# Patient Record
Sex: Male | Born: 1975 | Race: White | Hispanic: No | Marital: Married | Smoking: Former smoker
Health system: Southern US, Community
[De-identification: ages and names within clinical notes are randomized; demographics above are authoritative.]

## PROBLEM LIST (undated history)

## (undated) DIAGNOSIS — R569 Unspecified convulsions: Secondary | ICD-10-CM

## (undated) DIAGNOSIS — R51 Headache: Secondary | ICD-10-CM

## (undated) DIAGNOSIS — K644 Residual hemorrhoidal skin tags: Secondary | ICD-10-CM

## (undated) DIAGNOSIS — K219 Gastro-esophageal reflux disease without esophagitis: Secondary | ICD-10-CM

## (undated) DIAGNOSIS — M419 Scoliosis, unspecified: Secondary | ICD-10-CM

## (undated) DIAGNOSIS — S0291XA Unspecified fracture of skull, initial encounter for closed fracture: Secondary | ICD-10-CM

## (undated) DIAGNOSIS — M545 Low back pain: Secondary | ICD-10-CM

## (undated) DIAGNOSIS — R011 Cardiac murmur, unspecified: Secondary | ICD-10-CM

## (undated) DIAGNOSIS — S069X9A Unspecified intracranial injury with loss of consciousness of unspecified duration, initial encounter: Secondary | ICD-10-CM

## (undated) DIAGNOSIS — T148XXA Other injury of unspecified body region, initial encounter: Secondary | ICD-10-CM

## (undated) DIAGNOSIS — G4730 Sleep apnea, unspecified: Secondary | ICD-10-CM

## (undated) DIAGNOSIS — F419 Anxiety disorder, unspecified: Secondary | ICD-10-CM

## (undated) DIAGNOSIS — I209 Angina pectoris, unspecified: Secondary | ICD-10-CM

## (undated) DIAGNOSIS — R413 Other amnesia: Secondary | ICD-10-CM

## (undated) DIAGNOSIS — Z87820 Personal history of traumatic brain injury: Secondary | ICD-10-CM

## (undated) DIAGNOSIS — F329 Major depressive disorder, single episode, unspecified: Secondary | ICD-10-CM

## (undated) DIAGNOSIS — F32A Depression, unspecified: Secondary | ICD-10-CM

## (undated) DIAGNOSIS — M1990 Unspecified osteoarthritis, unspecified site: Secondary | ICD-10-CM

## (undated) DIAGNOSIS — G43909 Migraine, unspecified, not intractable, without status migrainosus: Secondary | ICD-10-CM

## (undated) DIAGNOSIS — G8929 Other chronic pain: Secondary | ICD-10-CM

## (undated) HISTORY — DX: Other amnesia: R41.3

## (undated) HISTORY — DX: Migraine, unspecified, not intractable, without status migrainosus: G43.909

## (undated) HISTORY — PX: SHOULDER ARTHROSCOPY W/ ROTATOR CUFF REPAIR: SHX2400

## (undated) HISTORY — DX: Other injury of unspecified body region, initial encounter: T14.8XXA

## (undated) HISTORY — DX: Personal history of traumatic brain injury: Z87.820

## (undated) HISTORY — PX: ANTERIOR CRUCIATE LIGAMENT REPAIR: SHX115

---

## 2000-11-17 ENCOUNTER — Emergency Department (HOSPITAL_COMMUNITY): Admission: EM | Admit: 2000-11-17 | Discharge: 2000-11-17 | Payer: Self-pay | Admitting: Emergency Medicine

## 2000-11-17 ENCOUNTER — Encounter: Payer: Self-pay | Admitting: Emergency Medicine

## 2004-12-17 DIAGNOSIS — S0291XA Unspecified fracture of skull, initial encounter for closed fracture: Secondary | ICD-10-CM

## 2004-12-17 DIAGNOSIS — S069XAA Unspecified intracranial injury with loss of consciousness status unknown, initial encounter: Secondary | ICD-10-CM

## 2004-12-17 DIAGNOSIS — S069X9A Unspecified intracranial injury with loss of consciousness of unspecified duration, initial encounter: Secondary | ICD-10-CM

## 2004-12-17 HISTORY — DX: Unspecified fracture of skull, initial encounter for closed fracture: S02.91XA

## 2004-12-17 HISTORY — DX: Unspecified intracranial injury with loss of consciousness of unspecified duration, initial encounter: S06.9X9A

## 2004-12-17 HISTORY — DX: Unspecified intracranial injury with loss of consciousness status unknown, initial encounter: S06.9XAA

## 2005-02-13 ENCOUNTER — Encounter: Admission: RE | Admit: 2005-02-13 | Discharge: 2005-02-13 | Payer: Self-pay | Admitting: Orthopedic Surgery

## 2005-05-30 ENCOUNTER — Ambulatory Visit: Payer: Self-pay | Admitting: Physical Medicine & Rehabilitation

## 2005-05-30 ENCOUNTER — Inpatient Hospital Stay (HOSPITAL_COMMUNITY): Admission: AC | Admit: 2005-05-30 | Discharge: 2005-06-14 | Payer: Self-pay | Admitting: Emergency Medicine

## 2005-06-01 ENCOUNTER — Ambulatory Visit: Payer: Self-pay | Admitting: Cardiology

## 2005-06-26 ENCOUNTER — Encounter: Admission: RE | Admit: 2005-06-26 | Discharge: 2005-09-24 | Payer: Self-pay | Admitting: Psychology

## 2005-09-25 ENCOUNTER — Encounter: Admission: RE | Admit: 2005-09-25 | Discharge: 2005-12-24 | Payer: Self-pay | Admitting: Psychology

## 2005-10-31 ENCOUNTER — Encounter: Admission: RE | Admit: 2005-10-31 | Discharge: 2005-10-31 | Payer: Self-pay | Admitting: Neurosurgery

## 2008-01-23 ENCOUNTER — Encounter: Admission: RE | Admit: 2008-01-23 | Discharge: 2008-01-23 | Payer: Self-pay | Admitting: Orthopedic Surgery

## 2010-01-30 ENCOUNTER — Encounter: Payer: Self-pay | Admitting: Nurse Practitioner

## 2010-02-01 ENCOUNTER — Encounter (INDEPENDENT_AMBULATORY_CARE_PROVIDER_SITE_OTHER): Payer: Self-pay | Admitting: *Deleted

## 2010-02-06 ENCOUNTER — Encounter: Payer: Self-pay | Admitting: Nurse Practitioner

## 2010-02-14 ENCOUNTER — Telehealth: Payer: Self-pay | Admitting: Gastroenterology

## 2010-02-15 ENCOUNTER — Ambulatory Visit: Payer: Self-pay | Admitting: Gastroenterology

## 2010-02-15 ENCOUNTER — Encounter: Payer: Self-pay | Admitting: Nurse Practitioner

## 2010-02-15 DIAGNOSIS — K625 Hemorrhage of anus and rectum: Secondary | ICD-10-CM | POA: Insufficient documentation

## 2010-02-15 DIAGNOSIS — R112 Nausea with vomiting, unspecified: Secondary | ICD-10-CM

## 2010-02-15 DIAGNOSIS — R197 Diarrhea, unspecified: Secondary | ICD-10-CM

## 2010-02-15 DIAGNOSIS — M549 Dorsalgia, unspecified: Secondary | ICD-10-CM | POA: Insufficient documentation

## 2010-02-15 DIAGNOSIS — R1013 Epigastric pain: Secondary | ICD-10-CM

## 2010-02-15 DIAGNOSIS — R799 Abnormal finding of blood chemistry, unspecified: Secondary | ICD-10-CM | POA: Insufficient documentation

## 2010-02-15 DIAGNOSIS — F329 Major depressive disorder, single episode, unspecified: Secondary | ICD-10-CM

## 2010-02-15 DIAGNOSIS — R1319 Other dysphagia: Secondary | ICD-10-CM | POA: Insufficient documentation

## 2010-02-16 ENCOUNTER — Ambulatory Visit: Payer: Self-pay | Admitting: Gastroenterology

## 2010-02-17 ENCOUNTER — Encounter: Payer: Self-pay | Admitting: Gastroenterology

## 2010-02-17 ENCOUNTER — Telehealth: Payer: Self-pay | Admitting: Gastroenterology

## 2010-02-20 ENCOUNTER — Ambulatory Visit (HOSPITAL_COMMUNITY): Admission: RE | Admit: 2010-02-20 | Discharge: 2010-02-20 | Payer: Self-pay | Admitting: Gastroenterology

## 2010-02-21 ENCOUNTER — Telehealth: Payer: Self-pay | Admitting: Gastroenterology

## 2010-02-22 ENCOUNTER — Ambulatory Visit: Payer: Self-pay | Admitting: Gastroenterology

## 2010-02-22 ENCOUNTER — Encounter (INDEPENDENT_AMBULATORY_CARE_PROVIDER_SITE_OTHER): Payer: Self-pay | Admitting: *Deleted

## 2010-02-27 ENCOUNTER — Ambulatory Visit: Payer: Self-pay | Admitting: Gastroenterology

## 2011-01-07 ENCOUNTER — Encounter: Payer: Self-pay | Admitting: Gastroenterology

## 2011-01-16 NOTE — Procedures (Signed)
Summary: Upper Endoscopy  Patient: Valentine Barney Note: All result statuses are Final unless otherwise noted.  Tests: (1) Upper Endoscopy (EGD)   EGD Upper Endoscopy       DONE (C)     [facility]     [address]     [place], [place]  [address]           ENDOSCOPY PROCEDURE REPORT           PATIENT:  Alf, Doyle  MR#:  [id number]     BIRTHDATE:  07/04/1976, 33 yrs. old  GENDER:  male           ENDOSCOPIST:  Barbette Hair. Arlyce Dice, MD     Referred by:           PROCEDURE DATE:  02/16/2010     PROCEDURE:  EGD, diagnostic     ASA CLASS:  Class II     INDICATIONS:  abdominal pain           MEDICATIONS:   Fentanyl 100 mcg IV, Versed 10 mg IV, Benadryl 50     mg IV, glycopyrrolate (Robinal) 0.2 mg IV, simethacone 0.6 cc PO     9correction)     TOPICAL ANESTHETIC:  Exactacain Spray           DESCRIPTION OF PROCEDURE:   After the risks benefits and     alternatives of the procedure were thoroughly explained, informed     consent was obtained.  The LB GIF-H180 [id number] endoscope was     introduced through the mouth and advanced to the third portion of     the duodenum, without limitations.  The instrument was slowly     withdrawn as the mucosa was fully examined.     <<PROCEDUREIMAGES>>           The upper, middle, and distal third of the esophagus were     carefully inspected and no abnormalities were noted. The z-line     was well seen at the GEJ. The endoscope was pushed into the fundus     which was normal including a retroflexed view. The antrum,gastric     body, first and second part of the duodenum were unremarkable (see     image1, image2, image3, image4, image5, image6, image7, image8,     and image9).    Retroflexed views revealed no abnormalities.     The scope was then withdrawn from the patient and the procedure     completed.           COMPLICATIONS:  None           ENDOSCOPIC IMPRESSION:     1) Normal EGD     2) hyomax as needed for pain  RECOMMENDATIONS:     1) My office will arrange for you to have an abdominal     ultrasound performed.     2) Call office next 2-3 days to schedule an office appointment     for 2 weeks           REPEAT EXAM:  No           ______________________________     Barbette Hair. Arlyce Dice, MD           CC:  Cannon Kettle NP           n.     REVISED:  02/22/2010 10:40 AM     eSIGNED:   Barbette Hair. Uchenna Seufert at 02/22/2010 10:40 AM  Dail, Lerew, [id number]  Note: An exclamation mark (!) indicates a result that was not dispersed into the flowsheet. Document Creation Date: 02/22/2010 10:41 AM _______________________________________________________________________  (1) Order result status: Final Collection or observation date-time: 02/16/2010 16:23 Requested date-time:  Receipt date-time:  Reported date-time:  Referring Physician:   Ordering Physician: Melvia Heaps [phone]) Specimen Source:  Source: Launa Grill Order Number: [id number] Lab site:

## 2011-01-16 NOTE — Progress Notes (Signed)
Summary: triage  Phone Note From Other Clinic Call back at [phone]   Caller: [place], med assist Call For: Dr. Russella Dar Reason for Call: Schedule Patient Appt Summary of Call: Mitzi Hansen, NP would like pt seen sooner than sch'ed 3/17 for abd pain and fam hx col cancer Initial call taken by: Vallarie Mare,  February 14, 2010 10:50 AM  Follow-up for Phone Call        Patient  rescheduled to see Willette Cluster RNP 02-15-10 9:00 Follow-up by: Darcey Nora RN, CGRN,  February 14, 2010 10:55 AM

## 2011-01-16 NOTE — Letter (Signed)
Summary: New Patient letter  [facility]  [address] [place], [place] [address]   Phone: [phone]  Fax: [phone]       02/01/2010 MRN: [id number]  Shannon Pace [address] [place], [place]  [address]  Dear Shannon Pace,  Welcome to the Gastroenterology Division at [facility].    You are scheduled to see Dr.  Russella Dar on 03-02-10 at 10:00a.m. on the 3rd floor at [facility], [address]  We ask that you try to arrive at our office 15 minutes prior to your appointment time to allow for check-in.  We would like you to complete the enclosed self-administered evaluation form prior to your visit and bring it with you on the day of your appointment.  We will review it with you.  Also, please bring a complete list of all your medications or, if you prefer, bring the medication bottles and we will list them.  Please bring your insurance card so that we may make a copy of it.  If your insurance requires a referral to see a specialist, please bring your referral form from your primary care physician.  Co-payments are due at the time of your visit and may be paid by cash, check or credit card.     Your office visit will consist of a consult with your physician (includes a physical exam), any laboratory testing he/she may order, scheduling of any necessary diagnostic testing (e.g. x-ray, ultrasound, CT-scan), and scheduling of a procedure (e.g. Endoscopy, Colonoscopy) if required.  Please allow enough time on your schedule to allow for any/all of these possibilities.    If you cannot keep your appointment, please call [phone] to cancel or reschedule prior to your appointment date.  This allows [place] the opportunity to schedule an appointment for another patient in need of care.  If you do not cancel or reschedule by 5 p.m. the business day prior to your appointment date, you will be charged a $50.00 late cancellation/no-show fee.    Thank you for  choosing [facility] for your medical needs.  We appreciate the opportunity to care for you.  Please visit [place] at our website  to learn more about our practice.                     Sincerely,                                                             The Gastroenterology Division

## 2011-01-16 NOTE — Letter (Signed)
Summary: Out of Work  [facility]  [address] [place], [place] [address]   Phone: [phone]  Fax: [phone]    February 15, 2010   Employee:  YESHAYA VATH    To Whom It May Concern:   For Medical reasons, please excuse the above named employee from work for the following dates:  Start:   02-14-10  Including 02-15-10  If you need additional information, please feel free to contact our office.         Sincerely,     Willette Cluster [facility]

## 2011-01-16 NOTE — Letter (Signed)
Summary: EGD Instructions  [facility]  [address] [place], [place] [address]   Phone: [phone]  Fax: [phone]       Shannon Pace    1976/12/09    MRN: [id number]       Procedure Day /Date: 02-27-10     Arrival Time: 9:00 AM      Procedure Time: 10:00 AM     Location of Procedure:                    X     [facility] (4th Floor)  PREPARATION FOR ENDOSCOPY   On 02-27-10 THE DAY OF THE PROCEDURE:  1.   No solid foods, milk or milk products are allowed after midnight the night before your procedure.  2.   Do not drink anything colored red or purple.  Avoid juices with pulp.  No orange juice.  3.  You may drink clear liquids until 8:00 AM  which is 2 hours before your procedure.                                                                                                CLEAR LIQUIDS INCLUDE: Water Jello Ice Popsicles Tea (sugar ok, no milk/cream) Powdered fruit flavored drinks Coffee (sugar ok, no milk/cream) Gatorade Juice: apple, white grape, white cranberry  Lemonade Clear bullion, consomm, broth Carbonated beverages (any kind) Strained chicken noodle soup Hard Candy   MEDICATION INSTRUCTIONS  Unless otherwise instructed, you should take regular prescription medications with a small sip of water as early as possible the morning of your procedure.         OTHER INSTRUCTIONS  You will need a responsible adult at least 34 years of age to accompany you and drive you home.   This person must remain in the waiting room during your procedure.  Wear loose fitting clothing that is easily removed.  Leave jewelry and other valuables at home.  However, you may wish to bring a book to read or an iPod/MP3 player to listen to music as you wait for your procedure to start.  Remove all body piercing jewelry and leave at home.  Total time from sign-in until discharge is approximately 2-3 hours.  You should go home directly after  your procedure and rest.  You can resume normal activities the day after your procedure.  The day of your procedure you should not:   Drive   Make legal decisions   Operate machinery   Drink alcohol   Return to work  You will receive specific instructions about eating, activities and medications before you leave.    The above instructions have been reviewed and explained to me by   _______________________    I fully understand and can verbalize these instructions _____________________________ Date _________

## 2011-01-16 NOTE — Assessment & Plan Note (Signed)
Summary: family hx of colon cancer,abd pain--ch.   History of Present Illness Visit Type: Initial Consult Primary GI MD: Melvia Heaps MD [facility] Primary Provider: Cannon Kettle, MD Chief Complaint: Patient c/o 1 year intermittent upper abdominal pain which wakes him up at night. He also c/o severe constipation and small amounts brbpr at times. He has had 1-2 episodes of nausea and vomiting. History of Present Illness:   11 -[age] year old male, new to this practice, and presenting with a 1.5 year history of episodic epigastric pain associated with nausea and always followed by diarrhea. Episodes occur sporadically, lasts 2-3 days at a time and seem to be occuring more frequently. During these episodes patient has to rest and consume only Gatorade and crackers. Doesn't usually vomit with episodes but did last night.  In between episodes feels okay except for constipation which patient feels is secondary to medications.  Interesting patient tells me his symptoms began after starting Methadone. Has seen two PCPs in last 1.5 years and labs have all been normal.  No weight loss.  No associated fevers. Ocassional rectal bleeding on tissue but that is only after several episodes of diarrhea.   A couple of weeks ago PCP gave patient samples of Dexilant which seemed to help but then he wasn't in midst of an episode so difficult to tell.  He hasn't really ever had heartburn or reflux. Recently given prescription for Klonipin which he has not yet started.    GI Review of Systems    Reports abdominal pain, nausea, and  vomiting.     Location of  Abdominal pain: upper abdomen.    Denies acid reflux, belching, bloating, chest pain, dysphagia with liquids, dysphagia with solids, heartburn, loss of appetite, vomiting blood, weight loss, and  weight gain.      Reports constipation, diarrhea, and  rectal bleeding.     Denies anal fissure, black tarry stools, change in bowel habit, diverticulosis, fecal  incontinence, heme positive stool, hemorrhoids, irritable bowel syndrome, jaundice, light color stool, liver problems, and  rectal pain. Preventive Screening-Counseling & Management  Alcohol-Tobacco     Smoking Status: current  Caffeine-Diet-Exercise     Does Patient Exercise: no      Drug Use:  no.      Current Medications (verified): 1)  Celexa 40 Mg Tabs (Citalopram Hydrobromide) .... Take 1 Tablet By Mouth Once A Day 2)  Methadone Hcl 10 Mg Tabs (Methadone Hcl) .... Take 1 Tablet Up To Four Times Daily As Needed  Allergies (verified): 1)  ! Pcn  Past History:  Past Medical History: Depression Anxiety Scoliosis  Past Surgical History: Rotator cuff repair-right Left Knee ACL surgery Sinus Surgery  Family History: Family History of Colon Cancer: Paternal Grandfather Family History of Colon Polyps: Sister Father had diverticulosis  Social History: Patient currently smokes. -15 cigarrettes Alcohol Use - yes-occasional Daily Caffeine Use-12 ounces daily Patient does not get regular exercise.  Illicit Drug Use - no Smoking Status:  current Does Patient Exercise:  no Drug Use:  no  Review of Systems  The patient denies allergy/sinus, anemia, anxiety-new, arthritis/joint pain, back pain, blood in urine, breast changes/lumps, change in vision, confusion, cough, coughing up blood, depression-new, fainting, fatigue, fever, headaches-new, hearing problems, heart murmur, heart rhythm changes, itching, menstrual pain, muscle pains/cramps, night sweats, nosebleeds, pregnancy symptoms, shortness of breath, skin rash, sleeping problems, sore throat, swelling of feet/legs, swollen lymph glands, thirst - excessive , urination - excessive , urination changes/pain, urine leakage, vision changes,  and voice change.    Vital Signs:  Patient profile:   35 year old male Height:      67 inches Weight:      199.50 pounds BMI:     31.36 BSA:     2.02 Pulse rate:   60 / minute Pulse  rhythm:   regular BP sitting:   110 / 68  (right arm)  Vitals Entered By: Hortense Ramal CMA Duncan Dull) (February 15, 2010 9:15 AM)  Physical Exam  General:  Well developed, well nourished, no acute distress. Head:  Normocephalic and atraumatic. Eyes:  Conjunctiva pink, no icterus.  Mouth:  No oral lesions. Tongue moist.  Neck:  no obvious masses  Lungs:  Clear throughout to auscultation. Heart:  Regular rate and rhythm; no murmurs, rubs,  or bruits. Abdomen:  Abdomen soft, nontender, nondistended. No obvious masses or hepatomegaly.Normal bowel sounds.  Msk:  Symmetrical with no gross deformities. Normal posture. Extremities:  No palmar erythema, no edema.  Neurologic:  Alert and  oriented x4;  grossly normal neurologically. Skin:  Intact without significant lesions or rashes. Cervical Nodes:  No significant cervical adenopathy. Psych:  Alert and cooperative. Normal mood and affect.   Impression & Recommendations:  Problem # 1:  NAUSEA WITH VOMITING (ICD-787.01) Assessment Deteriorated Chronic, intermittent episodes of epigastric pain, nausea and diarrhea. Reviewed labs from PCP which reveal a normal CBC, CMET, amylase and lipase. The patient hasn't had any weight loss. He is fine in between episodes.  Made a point to state problems began after starting Methadone. Several things could be causing his epigastric pain and nausea. The associated diarrhea makes things less clear cut. Rule out PUD, bilary disease, narcotic bowel, IBD. Will start with upper endoscopy and if that is negative will proceed with ultrasound of the abdomen. If above negative, he will likely need colonoscopy.  The patient will be scheduled for an EGD with biopsies ( if indicated).  The risks and benefits of the procedure, as well as alternatives were discussed with the patient and he agrees to proceed. The patient is on chronic Methadone, hopefully conscious sedation will be adequate.  Problem # 2:  DIARRHEA  (ICD-787.91) Assessment: Comment Only See #1.   Problem # 3:  BACK PAIN, CHRONIC (ICD-724.5) Assessment: Comment Only On Methadone.  Problem # 4:  DEPRESSION (ICD-311) Assessment: Comment Only Treated.  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  Other Orders: Ultrasound Abdomen (UAS) EGD (EGD)  Patient Instructions: 1)  We have scheduled the Endoscopy with Dr. Arlyce Dice on 02-27-10.  2)  [facility] Patient Information Guide given to patient. 3)  Ensocopy brochure given. 4)  Scheduled the Ultra Sound  03-20-10 at [facility]  5)  Copy sent to : Cannon Kettle, MD 6)  The medication list was reviewed and reconciled.  All changed / newly prescribed medications were explained.  A complete medication list was provided to the patient / caregiver.

## 2011-01-16 NOTE — Letter (Signed)
Summary: Appt Reminder 2  [facility]  [address] [place], [place] [address]   Phone: [phone]  Fax: [phone]        February 17, 2010 MRN: [id number]    Shannon Pace [address] [place], [place]  [address]    Dear [name],   You have a return appointment with Dr.Robert Arlyce Dice on 03-06-10 at 9:30am. Please remember to bring a complete list of the medicines you are taking, your insurance card and your co-pay.  If you have to cancel or reschedule this appointment, please call before 5:00 pm the evening before to avoid a cancellation fee.  If you have any questions or concerns, please call [phone].    Sincerely,    Laureen Ochs LPN  Appended Document: Appt Reminder 2 Letter mailed to patient.

## 2011-01-16 NOTE — Letter (Signed)
Summary: Out of Work  [facility]  [address] [place], [place] [address]   Phone: [phone]  Fax: [phone]    February 22, 2010   Employee:  LESLEY GALENTINE    To Whom It May Concern:   For Medical reasons, please excuse the above named employee from work for the following dates:  Start:   02-16-09  End: 02-22-09.  Pt.may return to work without restrictions on 02-23-10.    If you need additional information, please feel free to contact our office.         Sincerely,        Dr.Robert Kaplan,MD    Laureen Ochs LPN  Appended Document: Out of Work Given to patient.

## 2011-01-16 NOTE — Progress Notes (Signed)
Summary: Ultrasound & REV Scheduled   Phone Note Outgoing Call   Call placed by: Laureen Ochs LPN,  February 17, 2010 12:22 PM Call placed to: Patient Summary of Call: Pt. is scheduled for an Abd. Ultrasound at [facility] on 02-20-10 at 11am, NPO for 6 hours prior. He is scheduled for a f/u OV with Dr.Kaplan on 03-06-10 at 9:30am. Pt. instructed to call back as needed.  Initial call taken by: Laureen Ochs LPN,  February 17, 2010 12:27 PM

## 2011-01-16 NOTE — Letter (Signed)
Summary: [facility] Instructions  [facility]  [address] [place], [place] [address]   Phone: [phone]  Fax: [phone]       TRYSTAN AKHTAR    March 22, 1976    MRN: [id number]        Procedure Day /Date:  Monday 02/27/2010     Arrival Time: 8:30 am      Procedure Time: 9:30 am     Location of Procedure:                    _x_   [facility] (4th Floor)   PREPARATION FOR COLONOSCOPY WITH MOVIPREP   Starting 5 days prior to your procedure Wednesday 3/9 do not eat nuts, seeds, popcorn, corn, beans, peas,  salads, or any raw vegetables.  Do not take any fiber supplements (e.g. Metamucil, Citrucel, and Benefiber).  THE DAY BEFORE YOUR PROCEDURE         DATE: Sunday 3/13  1.  Drink clear liquids the entire day-NO SOLID FOOD  2.  Do not drink anything colored red or purple.  Avoid juices with pulp.  No orange juice.  3.  Drink at least 64 oz. (8 glasses) of fluid/clear liquids during the day to prevent dehydration and help the prep work efficiently.  CLEAR LIQUIDS INCLUDE: Water Jello Ice Popsicles Tea (sugar ok, no milk/cream) Powdered fruit flavored drinks Coffee (sugar ok, no milk/cream) Gatorade Juice: apple, white grape, white cranberry  Lemonade Clear bullion, consomm, broth Carbonated beverages (any kind) Strained chicken noodle soup Hard Candy                             4.  In the morning, mix first dose of MoviPrep solution:    Empty 1 Pouch A and 1 Pouch B into the disposable container    Add lukewarm drinking water to the top line of the container. Mix to dissolve    Refrigerate (mixed solution should be used within 24 hrs)  5.  Begin drinking the prep at 5:00 p.m. The MoviPrep container is divided by 4 marks.   Every 15 minutes drink the solution down to the next mark (approximately 8 oz) until the full liter is complete.   6.  Follow completed prep with 16 oz of clear liquid of your choice (Nothing red or purple).   Continue to drink clear liquids until bedtime.  7.  Before going to bed, mix second dose of MoviPrep solution:    Empty 1 Pouch A and 1 Pouch B into the disposable container    Add lukewarm drinking water to the top line of the container. Mix to dissolve    Refrigerate  THE DAY OF YOUR PROCEDURE      DATE: Monday 3/14  Beginning at 4:30 am (5 hours before procedure):         1. Every 15 minutes, drink the solution down to the next mark (approx 8 oz) until the full liter is complete.  2. Follow completed prep with 16 oz. of clear liquid of your choice.    3. You may drink clear liquids until 7:30 am (2 HOURS BEFORE PROCEDURE).   MEDICATION INSTRUCTIONS  Unless otherwise instructed, you should take regular prescription medications with a small sip of water   as early as possible the morning of your procedure.          OTHER INSTRUCTIONS  You will need a responsible adult at least  34 years of age to accompany you and drive you home.   This person must remain in the waiting room during your procedure.  Wear loose fitting clothing that is easily removed.  Leave jewelry and other valuables at home.  However, you may wish to bring a book to read or  an iPod/MP3 player to listen to music as you wait for your procedure to start.  Remove all body piercing jewelry and leave at home.  Total time from sign-in until discharge is approximately 2-3 hours.  You should go home directly after your procedure and rest.  You can resume normal activities the  day after your procedure.  The day of your procedure you should not:   Drive   Make legal decisions   Operate machinery   Drink alcohol   Return to work  You will receive specific instructions about eating, activities and medications before you leave.    The above instructions have been reviewed and explained to me by   Ezra Sites RN  February 22, 2010 5:07 PM     I fully understand and can verbalize these  instructions _____________________________ Date _________

## 2011-01-16 NOTE — Letter (Signed)
Summary: [facility]  [facility]   Imported By: Lester McGregor 02/21/2010 10:48:02  _____________________________________________________________________  External Attachment:    Type:   Image     Comment:   External Document

## 2011-01-16 NOTE — Miscellaneous (Signed)
  Clinical Lists Changes  Medications: Added new medication of HYOMAX-SL 0.125 MG SUBL (HYOSCYAMINE SULFATE) take 2 tabs s.l. q4h as needed abd pain - Signed Rx of HYOMAX-SL 0.125 MG SUBL (HYOSCYAMINE SULFATE) take 2 tabs s.l. q4h as needed abd pain;  #20 x 2;  Signed;  Entered by: Louis Meckel MD;  Authorized by: Louis Meckel MD;  Method used: [facility]  [id number] [place], [place], [place]  [address], Ph: [phone] or [phone], Fax: [phone]    Prescriptions: HYOMAX-SL 0.125 MG SUBL (HYOSCYAMINE SULFATE) take 2 tabs s.l. q4h as needed abd pain  #20 x 2   Entered and Authorized by:   Louis Meckel MD   Signed by:   Louis Meckel MD on 02/16/2010   Method used:   Electronically to        [facility]  [address] [phone][facility] (retail)       [address] [place], [place]  [address]       Ph: [phone] or [phone]       Fax: [phone]   RxID:   [id number]

## 2011-01-16 NOTE — Procedures (Signed)
Summary: Colonoscopy  Patient: [name] Note: All result statuses are Final unless otherwise noted.  Tests: (1) Colonoscopy (COL)   COL Colonoscopy           DONE     [facility]     [address]     [place], [place]  [address]           COLONOSCOPY PROCEDURE REPORT           PATIENT:  Shannon Pace, Shannon Pace  MR#:  [id number]     BIRTHDATE:  [birth year], 33 yrs. old  GENDER:  male           ENDOSCOPIST:  Barbette Hair. Arlyce Dice, MD     Referred by:           PROCEDURE DATE:  02/27/2010     PROCEDURE:  Colonoscopy, Diagnostic     ASA CLASS:  Class II     INDICATIONS:  abdominal pain, rectal bleeding           MEDICATIONS:   Fentanyl 100 mcg IV, Versed 10 mg IV, Benadryl 37.5     mg IV           DESCRIPTION OF PROCEDURE:   After the risks benefits and     alternatives of the procedure were thoroughly explained, informed     consent was obtained.  Digital rectal exam was performed and     revealed no abnormalities.   The LB CF-[id number] endoscope     was introduced through the anus and advanced to the cecum, which     was identified by both the appendix and ileocecal valve, without     limitations.  The quality of the prep was adequate, using     Nulytley.  The instrument was then slowly withdrawn as the colon     was fully examined.     <<PROCEDUREIMAGES>>           FINDINGS:  normal cecum (see image1, image2, image3, image6,     image8, image10, image11, image14, image17, and image19).     Retroflexed views in the rectum revealed no abnormalities.    The     scope was then withdrawn from the patient and the procedure     completed.           COMPLICATIONS:  None           ENDOSCOPIC IMPRESSION:     1) Normal cecum     RECOMMENDATIONS:     1) continue current medications - hyomax as needed for pain           REPEAT EXAM:  No           ______________________________     Barbette Hair. Arlyce Dice, MD           CC:  Cannon Kettle NP           n.     Rosalie Doctor:    Barbette Hair. Emillie Chasen at 02/27/2010 11:01 AM           Norval Gable, [id number]  Note: An exclamation mark (!) indicates a result that was not dispersed into the flowsheet. Document Creation Date: 02/27/2010 11:01 AM _______________________________________________________________________  (1) Order result status: Final Collection or observation date-time: 02/27/2010 10:53 Requested date-time:  Receipt date-time:  Reported date-time:  Referring Physician:   Ordering Physician: Melvia Heaps [phone]) Specimen Source:  Source: Launa Grill Order Number: [id number] Lab site:

## 2011-01-16 NOTE — Progress Notes (Signed)
Summary: TRIAGE-Colonoscopy scheduled  Phone Note Call from Patient Call back at [phone]   Caller: spouse  [name] Call For: Dr. Arlyce Dice Reason for Call: Lab or Test Results Summary of Call: Pt. is calling about his ultasound results Initial call taken by: Karna Christmas,  February 21, 2010 2:20 PM  Follow-up for Phone Call        Given [place] results.Wife is asking what the next step is as he started this am with same symptoms Also requesting a colon because pat. grandfather had colon cancer. Follow-up by: Teryl Lucy RN,  February 21, 2010 2:46 PM  Additional Follow-up for Phone Call Additional follow up Details #1::        Wonders if she should bring him in to office or Emergency Room while he is having the pain. Has never been seen while he was actually having the pain. Leanor Kail [facility]  February 21, 2010 4:19 PM    Additional Follow-up for Phone Call Additional follow up Details #2::    See PA or NP today.  needs amylase, lipase, cbc and cmet today Follow-up by: Louis Meckel MD,  February 22, 2010 2:01 PM  Additional Follow-up for Phone Call Additional follow up Details #3:: Details for Additional Follow-up Action Taken:  Had symptoms yesterday around noon has now  subsided  does'nt understand why the blood work because previous labs normal.Says what I need now is the colonoscopy which should of been done first due to family hx. Is going to discuss with wife as to whether he wants to come in since pain has subsided. Teryl Lucy RN, March 9,2011 2:24PM  1)Pt. wife called back. States when pt. has the episodes of pain they are epigastric, & has n/v, diarrhea. Denies fever. Pt. wife advised to callback ASAP when pt. has the pain and we can get labs and have him seen.   2) Pt. wife states pt. grandfather had colon cancer and his sisters have polyps, pt. wife states,"He should have had a Colonoscopy at 58, he needs one to be scheduled now, I want it done right away" States pt. sees occ. blood in  stool and on tissue and has occ. lower abd. pain.  DR.Cherrie Franca-CAN PT. BE SCHEDULED FOR A COLONOSCOPY?  Additional Follow-up by: Laureen Ochs LPN,  February 22, 2010 2:37 PM  Per Dr.Breyer Tejera--Pt. may be scheduled for a Colonoscopy. Pt. is here in the office, he will have  previsit now and have a Colonosocpy in [facility] on 02-27-10 at 9:30am. Pt. instructed to call back as needed. Laureen Ochs LPN  February 22, 9810 4:44 PM

## 2011-01-16 NOTE — Miscellaneous (Signed)
Summary: [facility].  Clinical Lists Changes  Medications: Added new medication of MOVIPREP 100 GM  SOLR (PEG-KCL-NACL-NASULF-NA ASC-C) As per prep instructions. - Signed Rx of MOVIPREP 100 GM  SOLR (PEG-KCL-NACL-NASULF-NA ASC-C) As per prep instructions.;  #1 x 0;  Signed;  Entered by: Ezra Sites RN;  Authorized by: Louis Meckel MD;  Method used: Electronically to [phone] [address] [place], [place], [place]  [address], Ph: [phone] or [phone], Fax: [phone] Observations: Added new observation of ALLERGY REV: Done (02/22/2010 16:43)    Prescriptions: MOVIPREP 100 GM  SOLR (PEG-KCL-NACL-NASULF-NA ASC-C) As per prep instructions.  #1 x 0   Entered by:   Ezra Sites RN   Authorized by:   Louis Meckel MD   Signed by:   Ezra Sites RN on 02/22/2010   Method used:   Electronically to        [facility]  [phone][facility] (retail)       [address]       [place], [place]  [address]       Ph: [phone] or [phone]       Fax: [phone]   RxID:   [id number]
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
## 2011-08-01 ENCOUNTER — Telehealth: Payer: Self-pay | Admitting: *Deleted

## 2011-08-01 ENCOUNTER — Telehealth: Payer: Self-pay | Admitting: Gastroenterology

## 2011-08-01 NOTE — Telephone Encounter (Signed)
Typed letter for pt that he was having a procedure on 02/28/2011,

## 2011-08-01 NOTE — Telephone Encounter (Signed)
Pt needs the notes from last year that was wrote to him for him being out of work. Printed from [employer]

## 2011-08-10 ENCOUNTER — Emergency Department (HOSPITAL_BASED_OUTPATIENT_CLINIC_OR_DEPARTMENT_OTHER)
Admission: EM | Admit: 2011-08-10 | Discharge: 2011-08-10 | Disposition: A | Discharge status: Home / Self Care | Payer: BC Managed Care – PPO | Attending: Emergency Medicine | Admitting: Emergency Medicine

## 2011-08-10 ENCOUNTER — Encounter: Payer: Self-pay | Admitting: *Deleted

## 2011-08-10 ENCOUNTER — Other Ambulatory Visit: Payer: Self-pay

## 2011-08-10 ENCOUNTER — Emergency Department (INDEPENDENT_AMBULATORY_CARE_PROVIDER_SITE_OTHER): Payer: BC Managed Care – PPO

## 2011-08-10 DIAGNOSIS — R0602 Shortness of breath: Secondary | ICD-10-CM

## 2011-08-10 DIAGNOSIS — R079 Chest pain, unspecified: Secondary | ICD-10-CM | POA: Insufficient documentation

## 2011-08-10 DIAGNOSIS — K219 Gastro-esophageal reflux disease without esophagitis: Secondary | ICD-10-CM | POA: Insufficient documentation

## 2011-08-10 DIAGNOSIS — F17200 Nicotine dependence, unspecified, uncomplicated: Secondary | ICD-10-CM | POA: Insufficient documentation

## 2011-08-10 HISTORY — DX: Unspecified fracture of skull, initial encounter for closed fracture: S02.91XA

## 2011-08-10 HISTORY — DX: Gastro-esophageal reflux disease without esophagitis: K21.9

## 2011-08-10 HISTORY — DX: Unspecified intracranial injury with loss of consciousness of unspecified duration, initial encounter: S06.9X9A

## 2011-08-10 HISTORY — DX: Scoliosis, unspecified: M41.9

## 2011-08-10 LAB — BASIC METABOLIC PANEL
BUN: 17 mg/dL (ref 6–23)
Chloride: 102 mEq/L (ref 96–112)
Glucose, Bld: 77 mg/dL (ref 70–99)
Potassium: 4 mEq/L (ref 3.5–5.1)
Sodium: 137 mEq/L (ref 135–145)

## 2011-08-10 LAB — CBC
HCT: 42.3 % (ref 39.0–52.0)
Hemoglobin: 15 g/dL (ref 13.0–17.0)
MCH: 31.8 pg (ref 26.0–34.0)
MCHC: 35.5 g/dL (ref 30.0–36.0)
RBC: 4.72 MIL/uL (ref 4.22–5.81)

## 2011-08-10 LAB — CARDIAC PANEL(CRET KIN+CKTOT+MB+TROPI)
CK, MB: 3.7 ng/mL (ref 0.3–4.0)
Relative Index: 2.1 (ref 0.0–2.5)
Total CK: 175 U/L (ref 7–232)

## 2011-08-10 NOTE — ED Notes (Signed)
Report given to Sylvia, RN

## 2011-08-10 NOTE — ED Provider Notes (Signed)
History     CSN: [id number] Arrival date & time: 08/10/2011  6:22 PM  Chief Complaint  Patient presents with  . Chest Pain   HPI Comments: Pt states that he had the pain 3 days ago and it resolved with ibuprofen and antacids:pt states that he returned today and it eased up with the medications, but it has not completely resolved  Patient is a 35 y.o. male presenting with chest pain. The history is provided by the patient. No language interpreter was used.  Chest Pain The chest pain began 3 - 5 days ago. Chest pain occurs intermittently. The chest pain is improving. The pain is associated with breathing. The severity of the pain is severe. The quality of the pain is described as sharp. The pain does not radiate. Chest pain is worsened by deep breathing. Primary symptoms include shortness of breath. Pertinent negatives for primary symptoms include no syncope, no cough, no palpitations, no nausea and no vomiting.  Pertinent negatives for associated symptoms include no near-syncope, no numbness, no paroxysmal nocturnal dyspnea and no weakness. He tried NSAIDs and histamine-2 blockers for the symptoms. Risk factors include male gender and smoking/tobacco exposure.  Pertinent negatives for past medical history include no COPD.  Pertinent negatives for family medical history include: no heart disease in family, no stroke in family and no sudden death in family.     Past Medical History  Diagnosis Date  . Scoliosis   . Traumatic brain injury   . Skull fracture   . GERD (gastroesophageal reflux disease)     Past Surgical History  Procedure Date  . Rotator cuff repair   . Anterior cruciate ligament repair     No family history on file.  History  Substance Use Topics  . Smoking status: Current Everyday Smoker  . Smokeless tobacco: Not on file  . Alcohol Use: Yes      Review of Systems  Respiratory: Positive for shortness of breath. Negative for cough.   Cardiovascular: Positive for  chest pain. Negative for palpitations, syncope and near-syncope.  Gastrointestinal: Negative for nausea and vomiting.  Neurological: Negative for weakness and numbness.  All other systems reviewed and are negative.    Physical Exam  BP 111/64  Pulse 86  Temp(Src) 97.9 F (36.6 C) (Oral)  Resp 18  Ht 5\' 7"  (1.702 m)  Wt 205 lb (92.987 kg)  BMI 32.11 kg/m2  SpO2 100%  Physical Exam  Nursing note and vitals reviewed. Constitutional: He is oriented to person, place, and time. He appears well-developed and well-nourished.  HENT:  Head: Normocephalic and atraumatic.  Eyes: Pupils are equal, round, and reactive to light.  Neck: Normal range of motion. Neck supple.  Cardiovascular: Normal rate and regular rhythm.   Pulmonary/Chest: Effort normal and breath sounds normal.  Abdominal: Soft. Bowel sounds are normal.  Musculoskeletal: Normal range of motion.  Neurological: He is alert and oriented to person, place, and time.    ED Course  Procedures Results for orders placed during the hospital encounter of 08/10/11  BASIC METABOLIC PANEL      Component Value Range   Sodium 137  135 - 145 (mEq/L)   Potassium 4.0  3.5 - 5.1 (mEq/L)   Chloride 102  96 - 112 (mEq/L)   CO2 28  19 - 32 (mEq/L)   Glucose, Bld 77  70 - 99 (mg/dL)   BUN 17  6 - 23 (mg/dL)   Creatinine, Ser 4.09  0.50 - 1.35 (mg/dL)  Calcium 9.4  8.4 - 10.5 (mg/dL)   GFR calc non Af Amer >60  >60 (mL/min)   GFR calc Af Amer >60  >60 (mL/min)  CBC      Component Value Range   WBC 12.4 (*) 4.0 - 10.5 (K/uL)   RBC 4.72  4.22 - 5.81 (MIL/uL)   Hemoglobin 15.0  13.0 - 17.0 (g/dL)   HCT 16.1  09.6 - 04.5 (%)   MCV 89.6  78.0 - 100.0 (fL)   MCH 31.8  26.0 - 34.0 (pg)   MCHC 35.5  30.0 - 36.0 (g/dL)   RDW 40.9  81.1 - 91.4 (%)   Platelets 281  150 - 400 (K/uL)  CARDIAC PANEL(CRET KIN+CKTOT+MB+TROPI)      Component Value Range   Total CK PENDING  7 - 232 (U/L)   CK, MB 3.7  0.3 - 4.0 (ng/mL)   Troponin I <0.30  <0.30  (ng/mL)   Relative Index PENDING  0.0 - 2.5    Dg Chest 2 View  08/10/2011  *RADIOLOGY REPORT*  Clinical Data: Chest pain and shortness of breath.  CHEST - 2 VIEW  Comparison: 06/08/2005  Findings: Lungs are clear. The cardiopericardial silhouette is within normal limits for size. Imaged bony structures of the thorax are intact.  IMPRESSION: Stable exam.  No acute findings.  Original Report Authenticated By: ERIC A. MANSELL, M.D.    Date: 08/10/2011  Rate: 81  Rhythm: normal sinus rhythm  QRS Axis: normal  Intervals: normal  ST/T Wave abnormalities: normal  Conduction Disutrbances:none  Narrative Interpretation:   Old EKG Reviewed: none available  MDM Pt has negative perc score and clinically not significant for that:no sign of infection noted on x-ray:pt can continued the ibuprofen and cimetidine      Teressa Lower, NP 08/10/11 1937

## 2011-08-10 NOTE — ED Notes (Signed)
Pt sts he awoke this am with a pressure feeling from his collar bones down to the bottom of his rib cage. Pt also c/o SOB. Pt reports similar episode 4 days ago that resolved on its own.

## 2011-08-10 NOTE — ED Provider Notes (Signed)
  I performed a history and physical examination of Chipper Herb and discussed his management with Teressa Lower, NP.  I agree with the history, physical, assessment, and plan of care, with the following exceptions: None  RRR.  No m/r/g.  Cp not reproducible Perc negative with low clinical gestalt for pe Do not feel that this is cardiac given the hx and physical examination ECG normal  I was present for the following procedures: None Time Spent in Critical Care of the patient: None  [place], Roney Mans, MD 08/10/11 [phone]

## 2011-08-10 NOTE — ED Notes (Signed)
Pt ambulatory to radiology and returned to ED room.

## 2011-09-11 ENCOUNTER — Other Ambulatory Visit: Payer: Self-pay | Admitting: Gastroenterology

## 2011-12-24 DIAGNOSIS — F411 Generalized anxiety disorder: Secondary | ICD-10-CM

## 2011-12-24 DIAGNOSIS — R413 Other amnesia: Secondary | ICD-10-CM

## 2012-08-18 ENCOUNTER — Encounter (HOSPITAL_COMMUNITY): Payer: Self-pay | Admitting: Emergency Medicine

## 2012-08-18 ENCOUNTER — Inpatient Hospital Stay (HOSPITAL_COMMUNITY)
Admission: EM | Admit: 2012-08-18 | Discharge: 2012-08-21 | DRG: 024 | Payer: BC Managed Care – PPO | Attending: Internal Medicine | Admitting: Internal Medicine

## 2012-08-18 DIAGNOSIS — Z79899 Other long term (current) drug therapy: Secondary | ICD-10-CM

## 2012-08-18 DIAGNOSIS — S069X9A Unspecified intracranial injury with loss of consciousness of unspecified duration, initial encounter: Secondary | ICD-10-CM

## 2012-08-18 DIAGNOSIS — F341 Dysthymic disorder: Secondary | ICD-10-CM | POA: Diagnosis present

## 2012-08-18 DIAGNOSIS — G9389 Other specified disorders of brain: Secondary | ICD-10-CM | POA: Diagnosis present

## 2012-08-18 DIAGNOSIS — R569 Unspecified convulsions: Principal | ICD-10-CM | POA: Diagnosis present

## 2012-08-18 DIAGNOSIS — K644 Residual hemorrhoidal skin tags: Secondary | ICD-10-CM

## 2012-08-18 DIAGNOSIS — R011 Cardiac murmur, unspecified: Secondary | ICD-10-CM

## 2012-08-18 DIAGNOSIS — F19939 Other psychoactive substance use, unspecified with withdrawal, unspecified: Secondary | ICD-10-CM | POA: Diagnosis present

## 2012-08-18 DIAGNOSIS — S069XAA Unspecified intracranial injury with loss of consciousness status unknown, initial encounter: Secondary | ICD-10-CM | POA: Insufficient documentation

## 2012-08-18 DIAGNOSIS — F1920 Other psychoactive substance dependence, uncomplicated: Secondary | ICD-10-CM | POA: Diagnosis present

## 2012-08-18 DIAGNOSIS — M549 Dorsalgia, unspecified: Secondary | ICD-10-CM

## 2012-08-18 DIAGNOSIS — K219 Gastro-esophageal reflux disease without esophagitis: Secondary | ICD-10-CM | POA: Diagnosis present

## 2012-08-18 DIAGNOSIS — D72829 Elevated white blood cell count, unspecified: Secondary | ICD-10-CM | POA: Diagnosis present

## 2012-08-18 DIAGNOSIS — F1320 Sedative, hypnotic or anxiolytic dependence, uncomplicated: Secondary | ICD-10-CM | POA: Diagnosis present

## 2012-08-18 DIAGNOSIS — G8929 Other chronic pain: Secondary | ICD-10-CM

## 2012-08-18 DIAGNOSIS — F17200 Nicotine dependence, unspecified, uncomplicated: Secondary | ICD-10-CM | POA: Diagnosis present

## 2012-08-18 DIAGNOSIS — R112 Nausea with vomiting, unspecified: Secondary | ICD-10-CM

## 2012-08-18 DIAGNOSIS — Z87820 Personal history of traumatic brain injury: Secondary | ICD-10-CM

## 2012-08-18 DIAGNOSIS — F329 Major depressive disorder, single episode, unspecified: Secondary | ICD-10-CM

## 2012-08-18 HISTORY — DX: Depression, unspecified: F32.A

## 2012-08-18 HISTORY — DX: Unspecified convulsions: R56.9

## 2012-08-18 HISTORY — DX: Headache: R51

## 2012-08-18 HISTORY — DX: Anxiety disorder, unspecified: F41.9

## 2012-08-18 HISTORY — DX: Major depressive disorder, single episode, unspecified: F32.9

## 2012-08-18 HISTORY — DX: Other chronic pain: G89.29

## 2012-08-18 HISTORY — DX: Residual hemorrhoidal skin tags: K64.4

## 2012-08-18 HISTORY — DX: Low back pain: M54.5

## 2012-08-18 HISTORY — DX: Cardiac murmur, unspecified: R01.1

## 2012-08-18 HISTORY — DX: Angina pectoris, unspecified: I20.9

## 2012-08-18 LAB — CBC WITH DIFFERENTIAL/PLATELET
Basophils Absolute: 0.1 10*3/uL (ref 0.0–0.1)
Eosinophils Relative: 0 % (ref 0–5)
HCT: 44.3 % (ref 39.0–52.0)
Lymphocytes Relative: 13 % (ref 12–46)
Lymphs Abs: 2.5 10*3/uL (ref 0.7–4.0)
MCV: 92.7 fL (ref 78.0–100.0)
Neutro Abs: 15 10*3/uL — ABNORMAL HIGH (ref 1.7–7.7)
Platelets: 290 10*3/uL (ref 150–400)
RBC: 4.78 MIL/uL (ref 4.22–5.81)
RDW: 12.9 % (ref 11.5–15.5)
WBC: 18.9 10*3/uL — ABNORMAL HIGH (ref 4.0–10.5)

## 2012-08-18 LAB — URINALYSIS, ROUTINE W REFLEX MICROSCOPIC
Bilirubin Urine: NEGATIVE
Hgb urine dipstick: NEGATIVE
Ketones, ur: NEGATIVE mg/dL
Nitrite: NEGATIVE
Protein, ur: NEGATIVE mg/dL
Urobilinogen, UA: 0.2 mg/dL (ref 0.0–1.0)

## 2012-08-18 LAB — COMPREHENSIVE METABOLIC PANEL
ALT: 25 U/L (ref 0–53)
AST: 22 U/L (ref 0–37)
Alkaline Phosphatase: 61 U/L (ref 39–117)
CO2: 24 mEq/L (ref 19–32)
Chloride: 102 mEq/L (ref 96–112)
GFR calc Af Amer: >90 mL/min (ref >90)
GFR calc non Af Amer: >90 mL/min (ref >90)
Glucose, Bld: 110 mg/dL — ABNORMAL HIGH (ref 70–99)
Sodium: 137 mEq/L (ref 135–145)
Total Bilirubin: 0.4 mg/dL (ref 0.3–1.2)

## 2012-08-18 MED ORDER — ACETAMINOPHEN 325 MG PO TABS
650.0000 mg | ORAL_TABLET | ORAL | Status: DC | PRN
  Administered 2012-08-19: 650 mg via ORAL
  Filled 2012-08-18: qty 2

## 2012-08-18 MED ORDER — SODIUM CHLORIDE 0.9 % IJ SOLN
3.0000 mL | Freq: Two times a day (BID) | INTRAMUSCULAR | Status: DC
  Administered 2012-08-18 – 2012-08-20 (×5): 3 mL via INTRAVENOUS

## 2012-08-18 MED ORDER — PROMETHAZINE HCL 25 MG PO TABS: 25.0000 mg | ORAL_TABLET | Freq: Four times a day (QID) | ORAL | Status: DC | PRN

## 2012-08-18 MED ORDER — CLONAZEPAM 0.5 MG PO TABS
0.5000 mg | ORAL_TABLET | Freq: Two times a day (BID) | ORAL | Status: DC
  Administered 2012-08-18 – 2012-08-21 (×6): 0.5 mg via ORAL
  Filled 2012-08-18 (×6): qty 1

## 2012-08-18 MED ORDER — METHADONE HCL 10 MG/ML PO CONC: 25.0000 mg | Freq: Every day | ORAL | Status: DC

## 2012-08-18 MED ORDER — LORAZEPAM 1 MG PO TABS
1.0000 mg | ORAL_TABLET | Freq: Four times a day (QID) | ORAL | Status: DC | PRN
  Administered 2012-08-19 (×4): 1 mg via ORAL
  Filled 2012-08-18 (×4): qty 1

## 2012-08-18 MED ORDER — IBUPROFEN 800 MG PO TABS
800.0000 mg | ORAL_TABLET | Freq: Four times a day (QID) | ORAL | Status: DC | PRN
  Administered 2012-08-19 – 2012-08-21 (×4): 800 mg via ORAL
  Filled 2012-08-18 (×5): qty 1

## 2012-08-18 MED ORDER — SODIUM CHLORIDE 0.9 % IV SOLN
INTRAVENOUS | Status: DC
  Administered 2012-08-18 – 2012-08-19 (×3): via INTRAVENOUS

## 2012-08-18 MED ORDER — LORAZEPAM 2 MG/ML IJ SOLN
INTRAMUSCULAR | Status: AC
  Administered 2012-08-18: 1 mg
  Filled 2012-08-18: qty 1

## 2012-08-18 MED ORDER — VITAMIN C 500 MG PO TABS
500.0000 mg | ORAL_TABLET | Freq: Every day | ORAL | Status: DC
  Administered 2012-08-19 – 2012-08-21 (×3): 500 mg via ORAL
  Filled 2012-08-18 (×3): qty 1

## 2012-08-18 MED ORDER — HYOSCYAMINE SULFATE 0.125 MG SL SUBL: 0.5000 mg | SUBLINGUAL_TABLET | SUBLINGUAL | Status: DC | PRN

## 2012-08-18 MED ORDER — LORAZEPAM 2 MG/ML IJ SOLN: 1.0000 mg | Freq: Four times a day (QID) | INTRAMUSCULAR | Status: DC | PRN

## 2012-08-18 MED ORDER — SERTRALINE HCL 100 MG PO TABS
100.0000 mg | ORAL_TABLET | Freq: Every day | ORAL | Status: DC
  Administered 2012-08-19 – 2012-08-21 (×3): 100 mg via ORAL
  Filled 2012-08-18 (×3): qty 1

## 2012-08-18 MED ORDER — BISMUTH SUBSALICYLATE 262 MG/15ML PO SUSP: 15.0000 mL | Freq: Four times a day (QID) | ORAL | Status: DC | PRN

## 2012-08-18 MED ORDER — ENOXAPARIN SODIUM 40 MG/0.4ML ~~LOC~~ SOLN
40.0000 mg | SUBCUTANEOUS | Status: DC
Start: 2012-08-19 — End: 2012-08-21
  Administered 2012-08-19 – 2012-08-21 (×3): 40 mg via SUBCUTANEOUS
  Filled 2012-08-18 (×3): qty 0.4
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
## 2012-08-18 NOTE — ED Notes (Signed)
MD at bedside. 

## 2012-08-18 NOTE — ED Notes (Signed)
PA at bedside.
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
## 2012-08-19 ENCOUNTER — Inpatient Hospital Stay (HOSPITAL_COMMUNITY): Payer: BC Managed Care – PPO

## 2012-08-19 ENCOUNTER — Encounter (HOSPITAL_COMMUNITY): Payer: Self-pay | Admitting: General Practice

## 2012-08-19 DIAGNOSIS — F1920 Other psychoactive substance dependence, uncomplicated: Secondary | ICD-10-CM

## 2012-08-19 DIAGNOSIS — R519 Headache, unspecified: Secondary | ICD-10-CM

## 2012-08-19 DIAGNOSIS — I209 Angina pectoris, unspecified: Secondary | ICD-10-CM

## 2012-08-19 DIAGNOSIS — D72829 Elevated white blood cell count, unspecified: Secondary | ICD-10-CM

## 2012-08-19 DIAGNOSIS — R569 Unspecified convulsions: Principal | ICD-10-CM

## 2012-08-19 DIAGNOSIS — F411 Generalized anxiety disorder: Secondary | ICD-10-CM

## 2012-08-19 HISTORY — DX: Headache, unspecified: R51.9

## 2012-08-19 HISTORY — DX: Angina pectoris, unspecified: I20.9

## 2012-08-19 LAB — CBC WITH DIFFERENTIAL/PLATELET
Basophils Relative: 1 % (ref 0–1)
Eosinophils Relative: 4 % (ref 0–5)
HCT: 44.2 % (ref 39.0–52.0)
Hemoglobin: 15.6 g/dL (ref 13.0–17.0)
Lymphs Abs: 3.4 10*3/uL (ref 0.7–4.0)
MCH: 33.3 pg (ref 26.0–34.0)
MCV: 94.2 fL (ref 78.0–100.0)
Monocytes Absolute: 1.5 10*3/uL — ABNORMAL HIGH (ref 0.1–1.0)
RBC: 4.69 MIL/uL (ref 4.22–5.81)

## 2012-08-19 LAB — COMPREHENSIVE METABOLIC PANEL
AST: 26 U/L (ref 0–37)
Albumin: 3.6 g/dL (ref 3.5–5.2)
Calcium: 9.5 mg/dL (ref 8.4–10.5)
Chloride: 104 mEq/L (ref 96–112)
Creatinine, Ser: 0.96 mg/dL (ref 0.50–1.35)
Total Protein: 6.8 g/dL (ref 6.0–8.3)

## 2012-08-19 LAB — RAPID URINE DRUG SCREEN, HOSP PERFORMED
Amphetamines: NOT DETECTED
Benzodiazepines: POSITIVE — AB
Tetrahydrocannabinol: NOT DETECTED

## 2012-08-19 LAB — MAGNESIUM: Magnesium: 2 mg/dL (ref 1.5–2.5)

## 2012-08-19 LAB — TSH: TSH: 0.638 u[IU]/mL (ref 0.350–4.500)

## 2012-08-19 MED ORDER — METHADONE HCL 10 MG PO TABS
25.0000 mg | ORAL_TABLET | Freq: Every day | ORAL | Status: DC
  Administered 2012-08-19 – 2012-08-21 (×3): 25 mg via ORAL
  Filled 2012-08-19 (×3): qty 3

## 2012-08-19 MED ORDER — LEVETIRACETAM 500 MG PO TABS
500.0000 mg | ORAL_TABLET | Freq: Two times a day (BID) | ORAL | Status: DC
  Administered 2012-08-20 – 2012-08-21 (×3): 500 mg via ORAL
  Filled 2012-08-19 (×4): qty 1

## 2012-08-19 MED ORDER — SODIUM CHLORIDE 0.9 % IV SOLN
500.0000 mg | Freq: Once | INTRAVENOUS | Status: AC
  Administered 2012-08-19: 500 mg via INTRAVENOUS
  Filled 2012-08-19: qty 5

## 2012-08-19 MED ORDER — GADOBENATE DIMEGLUMINE 529 MG/ML IV SOLN
20.0000 mL | Freq: Once | INTRAVENOUS | Status: AC | PRN
  Administered 2012-08-19: 20 mL via INTRAVENOUS

## 2012-08-19 MED ORDER — BIOTENE DRY MOUTH MT LIQD
15.0000 mL | Freq: Two times a day (BID) | OROMUCOSAL | Status: DC
  Administered 2012-08-19 – 2012-08-20 (×3): 15 mL via OROMUCOSAL

## 2012-08-19 MED ORDER — CHLORHEXIDINE GLUCONATE 0.12 % MT SOLN
15.0000 mL | Freq: Two times a day (BID) | OROMUCOSAL | Status: DC
  Administered 2012-08-19 – 2012-08-20 (×5): 15 mL via OROMUCOSAL
  Filled 2012-08-19 (×8): qty 15

## 2012-08-19 MED ORDER — LIDOCAINE VISCOUS 2 % MT SOLN
20.0000 mL | OROMUCOSAL | Status: DC | PRN
  Administered 2012-08-19 (×2): 20 mL via OROMUCOSAL
  Filled 2012-08-19 (×3): qty 20

## 2012-08-19 MED ORDER — BIOTENE DRY MOUTH MT LIQD
15.0000 mL | Freq: Two times a day (BID) | OROMUCOSAL | Status: DC
  Administered 2012-08-19: 15 mL via OROMUCOSAL

## 2012-08-19 NOTE — ED Provider Notes (Signed)
Medical screening examination/treatment/procedure(s) were conducted as a shared visit with non-physician practitioner(s) and myself.  I personally evaluated the patient during the encounter.  Also evaluated pt at the bedside.  Noted mild tremulousness, diaphoresis, and anxiety.  Concern for withdrawal from opiates/benzos.  Pt monitored closely while under ER care.  Tobin Chad, MD 08/19/12 [phone]

## 2012-08-19 NOTE — Progress Notes (Signed)
Visited pt per his request.  He said he had bn admitted because he'd had a seizure and was uncertain as to the cause.  Sd his theory was that it was due to w/drawal fr medication.  Spoke abt a painful incident in his church.  Offered prayer per his req for healing, guidance for medical team.

## 2012-08-19 NOTE — Care Management Note (Signed)
    Page 1 of 1   08/21/2012     4:48:42 PM   CARE MANAGEMENT NOTE 08/21/2012  Patient:  Shannon Pace, Shannon Pace   Account Number:  [id number]  Date Initiated:  08/19/2012  Documentation initiated by:  Onnie Boer  Subjective/Objective Assessment:   PT WAS ADMITTED WITH SZ     Action/Plan:   PROGRESSION OF CARE AND DISCHARGE PLANNING   Anticipated DC Date:  08/21/2012   Anticipated DC Plan:  HOME/SELF CARE  In-house referral  Clinical Social Worker      DC Planning Services  CM consult      Choice offered to / List presented to:             Status of service:  Completed, signed off Medicare Important Message given?   (If response is "NO", the following Medicare IM given date fields will be blank) Date Medicare IM given:   Date Additional Medicare IM given:    Discharge Disposition:  HOME/SELF CARE  Per UR Regulation:  Reviewed for med. necessity/level of care/duration of stay  If discussed at Long Length of Stay Meetings, dates discussed:    Comments:  08/21/12 Onnie Boer, RN, BSN 1647 PT WAS DC'D TO HOME AND WILL BE ADMITTED TO [facility].  08/19/12 Onnie Boer, RN, BSN 1501 PT WAS ADMITTED WITH NEW ONSET OF SZ AND POSSIBLE WITHDRAWAL.  WILL F/U ON DC NEEDS AND RECOMMENDATIONS.
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
## 2012-08-19 NOTE — H&P (Deleted)
Internal medicine teaching service Attending Dr.Kalle Bernath, Please see my H and P for additional documentation.

## 2012-08-19 NOTE — Progress Notes (Signed)
Medical Student Daily Progress Note  Subjective: Shannon Pace feels sore this morning, particularly in right shoulder, back and legs. He attributes this to his muscles tensing during his seizure-like episode. No acute complaints.  Objective: Vital signs in last 24 hours: Filed Vitals:   08/18/12 2023 08/18/12 2300 08/19/12 0600 08/19/12 1426  BP: 129/85 119/70 132/64 128/73  Pulse: 100 98 86 105  Temp:  98 F (36.7 C) 97.8 F (36.6 C) 98.9 F (37.2 C)  TempSrc:  Oral Oral Oral  Resp: 18 18 18 18   Height:  5\' 7"  (1.702 m)    Weight:  99.6 kg (219 lb 9.3 oz)    SpO2: 96% 100% 100% 97%   Weight change:  No intake or output data in the 24 hours ending 08/19/12 1512 Physical Exam: General appearance: alert, cooperative and no distress Head: Normocephalic, without obvious abnormality, atraumatic Throat: 2 cm laceration on right side of tongue, not bleeding. MMM, orophaynx without lesions Lungs: clear to auscultation bilaterally Heart: regular rate and rhythm, S1, S2 normal, no murmur, click, rub or gallop Extremities: extremities normal, atraumatic, no cyanosis or edema Pulses: 2+ and symmetric Neurologic: No facial weakness, tongue midline, grip 5/5 bilaterally, hip flexion 5/5, foot plantar flexion and dorsiflexion 5/5 bilaterally  Lab Results: Basic Metabolic Panel:  Lab 08/19/12 1610 08/18/12 1906  Maija Biggers 140 137  K 3.7 3.5  CL 104 102  CO2 25 24  GLUCOSE 95 110*  BUN 14 13  CREATININE 0.96 0.85  CALCIUM 9.5 10.1  MG -- --  PHOS -- --   Liver Function Tests:  Lab 08/19/12 0447 08/18/12 1906  AST 26 22  ALT 22 25  ALKPHOS 58 61  BILITOT 0.7 0.4  PROT 6.8 7.7  ALBUMIN 3.6 4.2   CBC:  Lab 08/19/12 0447 08/18/12 1906  WBC 19.1* 18.9*  NEUTROABS 13.2* 15.0*  HGB 15.6 16.1  HCT 44.2 44.3  MCV 94.2 92.7  PLT 274 290   Urine Drug Screen: Drugs of Abuse     Component Value Date/Time   LABOPIA NONE DETECTED 08/19/2012 1044   COCAINSCRNUR NONE DETECTED 08/19/2012  1044   LABBENZ POSITIVE* 08/19/2012 1044   AMPHETMU NONE DETECTED 08/19/2012 1044   THCU NONE DETECTED 08/19/2012 1044   LABBARB NONE DETECTED 08/19/2012 1044    Urinalysis:  Lab 08/18/12 1928  COLORURINE YELLOW  LABSPEC 1.022  PHURINE 5.5  GLUCOSEU NEGATIVE  HGBUR NEGATIVE  BILIRUBINUR NEGATIVE  KETONESUR NEGATIVE  PROTEINUR NEGATIVE  UROBILINOGEN 0.2  NITRITE NEGATIVE  LEUKOCYTESUR NEGATIVE     Studies/Results: Dg Chest 2 View  08/19/2012  *RADIOLOGY REPORT*  Clinical Data: History of seizure complicated by a fall.  Shoulder pain. Admission chest x-ray.  CHEST - 2 VIEW  Comparison: Chest x-ray 08/10/2011.  Findings: Lung volumes are normal.  No consolidative airspace disease.  No pleural effusions.  No pneumothorax.  No pulmonary nodule or mass noted.  Pulmonary vasculature and the cardiomediastinal silhouette are within normal limits.  S-shaped thoracic scoliosis convex to the left superiorly and to the right inferiorly.  The visualized bony thorax is grossly intact.  IMPRESSION: 1. No radiographic evidence of acute cardiopulmonary disease.   Original Report Authenticated By: Florencia Reasons, M.D.    Dg Shoulder Right  08/19/2012  *RADIOLOGY REPORT*  Clinical Data: Seizure, recent fall, right shoulder pain and bruising  RIGHT SHOULDER - 2+ VIEW  Comparison: MR right shoulder of 01/23/2008  Findings: The right humeral head is normally positioned.  There is a  slightly downward sloping acromion which can predispose to impingement but no acute abnormality is noted.  A vague lucency in the superior aspect of the distal right clavicle probably represents a vascular foramina.  IMPRESSION: No acute abnormality.  Slightly downward sloping acromion.   Original Report Authenticated By: Juline Patch, M.D.    Mr Shannon Pace HY Contrast  08/19/2012  *RADIOLOGY REPORT*  Clinical Data: Seizure.  Traumatic brain injury  MRI HEAD WITHOUT AND WITH CONTRAST  Technique:  Multiplanar, multiecho pulse sequences of  the brain and surrounding structures were obtained according to standard protocol without and with intravenous contrast  Contrast: 20mL MULTIHANCE GADOBENATE DIMEGLUMINE 529 MG/ML IV SOLN  Comparison: MRI 11/15/2011  Findings: Chronic encephalomalacia in the anterior frontal lobes and anterior temporal lobes bilaterally is unchanged from the prior MRI.  There is a mild amount of hemosiderin deposition in the frontal lobes related to prior trauma.  Ventricle size is normal.  Negative for acute infarct.  Negative for mass lesion.  Postcontrast imaging reveals normal enhancement.  No evidence of neoplasm or mass is identified.  Chronic sinusitis with mucosal thickening throughout the paranasal sinuses.  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  No acute abnormality.  Chronic sinusitis.   Original Report Authenticated By: Camelia Phenes, M.D.    Medications: I have reviewed the patient's current medications. Scheduled Meds:   . antiseptic oral rinse  15 mL Mouth Rinse q12n4p  . chlorhexidine  15 mL Mouth Rinse BID  . clonazePAM  0.5 mg Oral BID  . enoxaparin (LOVENOX) injection  40 mg Subcutaneous Q24H  . LORazepam      . methadone  25 mg Oral Daily  . sertraline  100 mg Oral Daily  . sodium chloride  3 mL Intravenous Q12H  . vitamin C  500 mg Oral Daily  . DISCONTD: antiseptic oral rinse  15 mL Mouth Rinse BID  . DISCONTD: methadone  25 mg Oral Daily   Continuous Infusions:   . sodium chloride 100 mL/hr at 08/19/12 1251   PRN Meds:.acetaminophen, bismuth subsalicylate, gadobenate dimeglumine, hyoscyamine, ibuprofen, lidocaine, LORazepam, LORazepam, promethazine Assessment/Plan: **SEIZURE: The patient experienced witnessed seizure-like activity. This was his first seizure. Has had no seizure like activity since admission. Potential causes include benzodiazepine withdrawal and prior TBI. He gives a history of significant benzodiazepine  tolerance and had large amounts of xanax intake through this weekend, about 30 pills over 4 days. Unfortunately it's unclear when exactly he stopped taking the Xanax in relation to his seizure. MRI shows frontal and temporal encephalomalacia that is unchanged from previous study and is likely due to TBI. These areas could be a focus of seizures. Work-up has otherwise been negative for etiology. --Continue clonazepam BID --Continue CIWA protocol --Have consulted neurology, appreciate their recs. Will likely need antiepileptics (Keppra 500 BID) given potential for future seizures from frontal and temporal encephalomalacia. --Will check TSH and magnesium levels for further work-up of seizure activity.  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 Is experiencing a little nervousness. --Continue clonazepam BID --Continue CIWA protocol --Have consulted Social Work about substance abuse +/- detox options.  Appreciate their recommendations.   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 He was trying to detox himself using methadone. We have called the methadone clinic and they verified that he saw them for the first time on 8/30 and last saw them on 9/2. He was  most recently on 25mg  qd of methadone. --Continue 25mg  tablets methadone daily.  **Anxiety/Depression --Continue home zoloft  --Is on clonazepam as above.  **DVT prophylaxis: Lovenox  **DISPO: --Will dispo following Neuro and Social Work Scientist, forensic.   LOS: 1 day   This is a Psychologist, occupational Note.  The care of the patient was discussed with Dr. Dierdre Searles and the assessment and plan formulated with their assistance.  Please see their attached note for official documentation of the daily encounter.  Warren Lacy 08/19/2012, 3:12 PM  Resident Co-sign Daily Note: I have seen the patient and reviewed the daily progress note by MS 4 Warren Lacy and  discussed the care of the patient with them.  See below for documentation of my findings, assessment, and plans.  Subjective: C/o tongue soreness from tongue biting 2/2 seizure. No more seizures observed.  Objective: Vital signs in last 24 hours: Filed Vitals:   08/18/12 2023 08/18/12 2300 08/19/12 0600 08/19/12 1426  BP: 129/85 119/70 132/64 128/73  Pulse: 100 98 86 105  Temp:  98 F (36.7 C) 97.8 F (36.6 C) 98.9 F (37.2 C)  TempSrc:  Oral Oral Oral  Resp: 18 18 18 18   Height:  5\' 7"  (1.702 m)    Weight:  219 lb 9.3 oz (99.6 kg)    SpO2: 96% 100% 100% 97%   Physical Exam: General appearance: alert, cooperative and no distress  Head: Normocephalic, without obvious abnormality, atraumatic  Throat: 2 cm laceration on right side of tongue, not bleeding. MMM, orophaynx without lesions  Lungs: clear to auscultation bilaterally  Heart: regular rate and rhythm, S1, S2 normal, no murmur, click, rub or gallop  Extremities: extremities normal, atraumatic, no cyanosis or edema  Pulses: 2+ and symmetric  Neurologic: No facial weakness, tongue midline, grip 5/5 bilaterally, hip flexion 5/5, foot plantar flexion and dorsiflexion 5/5 bilaterally   Lab Results: Reviewed and documented in Electronic Record Micro Results: Reviewed and documented in Electronic Record Studies/Results: Reviewed and documented in Electronic Record Medications: I have reviewed the patient's current medications. Scheduled Meds:   . antiseptic oral rinse  15 mL Mouth Rinse q12n4p  . chlorhexidine  15 mL Mouth Rinse BID  . clonazePAM  0.5 mg Oral BID  . enoxaparin (LOVENOX) injection  40 mg Subcutaneous Q24H  . levetiracetam  500 mg Intravenous Once  . levETIRAcetam  500 mg Oral BID  . LORazepam      . methadone  25 mg Oral Daily  . sertraline  100 mg Oral Daily  . sodium chloride  3 mL Intravenous Q12H  . vitamin C  500 mg Oral Daily  . DISCONTD: antiseptic oral rinse  15 mL Mouth Rinse BID  . DISCONTD:  methadone  25 mg Oral Daily   Continuous Infusions:   . DISCONTD: sodium chloride 100 mL/hr at 08/19/12 1251   PRN Meds:.acetaminophen, bismuth subsalicylate, gadobenate dimeglumine, hyoscyamine, ibuprofen, lidocaine, LORazepam, LORazepam, promethazine Assessment/Plan:  1. Seizure   First time seizure. The etiology is unclear. May be related to his TBI vs Benzo withdrawal.  - will consult neurology -continue Clonazepam   2. Benzo dependence  - SW for detox   3. OPIOID DEPENDENCE Methadone clinic contacted and states that he is on methadone 25 mg po daily  4. Depression Not depressed, no SI/HI  - continue home dose.  VTE: Lovenox.      LOS: 1 day   Soren Pigman 08/19/2012, 5:49 PM

## 2012-08-20 LAB — CBC WITH DIFFERENTIAL/PLATELET
Eosinophils Absolute: 0.9 10*3/uL — ABNORMAL HIGH (ref 0.0–0.7)
Eosinophils Relative: 7 % — ABNORMAL HIGH (ref 0–5)
HCT: 42.8 % (ref 39.0–52.0)
Hemoglobin: 14.9 g/dL (ref 13.0–17.0)
Lymphocytes Relative: 24 % (ref 12–46)
Lymphs Abs: 3.1 10*3/uL (ref 0.7–4.0)
MCH: 32.8 pg (ref 26.0–34.0)
MCV: 94.3 fL (ref 78.0–100.0)
Monocytes Absolute: 1 10*3/uL (ref 0.1–1.0)
Monocytes Relative: 7 % (ref 3–12)
Platelets: 270 10*3/uL (ref 150–400)
RBC: 4.54 MIL/uL (ref 4.22–5.81)
WBC: 13 10*3/uL — ABNORMAL HIGH (ref 4.0–10.5)

## 2012-08-20 MED ORDER — LORAZEPAM 1 MG PO TABS: 1.0000 mg | ORAL_TABLET | Freq: Three times a day (TID) | ORAL | Status: DC | PRN

## 2012-08-20 NOTE — Progress Notes (Signed)
Clinical Social Work Department BRIEF PSYCHOSOCIAL ASSESSMENT 08/20/2012  Patient:  Shannon Pace, Shannon Pace     Account Number:  [id number]     Admit date:  08/18/2012  Clinical Social Worker:  Conley Simmonds  Date/Time:  08/20/2012 10:00 AM  Referred by:  Physician  Date Referred:  08/19/2012 Referred for  Substance Abuse   Other Referral:   Interview type:  Patient Other interview type:   Pt wife and mother at bedside    PSYCHOSOCIAL DATA Living Status:  WIFE Admitted from facility:   Level of care:   Primary support name:  Shannon Pace Primary support relationship to patient:  SPOUSE Degree of support available:   Very Strong    CURRENT CONCERNS Current Concerns  Substance Abuse   Other Concerns:    SOCIAL WORK ASSESSMENT / PLAN CSW met with pt and wife at bedside to discuss SA history and concerns-Pt very open about SA history with pain medications following a TBI-Pt and wife are interested in inpatient however are understanding that there may be a lack of treatment beds available at d/c-  CSW reviewed various treatment options [facility] as well as [facility] in the community-  Pt wife works full time and they are concerned about transportation as they live in [place] and pt unable to drive-  CSW has contacted inpatient facilities and awaiting a call back from [facility] and [facility] have no treatment beds available today-  CSW will follow up with pt and wife with further options.   Assessment/plan status:  Psychosocial Support/Ongoing Assessment of Needs Other assessment/ plan:   Information/referral to community resources:   [facility]    PATIENT'S/FAMILY'S RESPONSE TO PLAN OF CARE: Pt and wife very eager and pt displays motivation and insight into disease and recovery-Pt family is very supportive and display many strengths that will encourage pt continued recovery-  Pt and family open to any and all services/resources  and CSW will continue to follow for disposition and support-  Jodean Lima, [phone]

## 2012-08-20 NOTE — Progress Notes (Signed)
Medical Student Daily Progress Note Subjective: Mr. Esterline feels better today. His muscle soreness is subsiding. Does not feel anxious or jittery this morning. Did obtain 4 doses of Ativan yesterday, maximum CIWA score 9 at 6 pm.  Objective: Vital signs in last 24 hours: Filed Vitals:   08/19/12 0600 08/19/12 1426 08/19/12 2200 08/20/12 0600  BP: 132/64 128/73 118/77 121/69  Pulse: 86 105 96 80  Temp: 97.8 F (36.6 C) 98.9 F (37.2 C) 98.3 F (36.8 C) 97.3 F (36.3 C)  TempSrc: Oral Oral Oral Oral  Resp: 18 18 18 18   Height:      Weight:      SpO2: 100% 97% 97% 98%   Physical Exam: General appearance: alert, cooperative and no distress Head: Normocephalic, without obvious abnormality, atraumatic Eyes: Pupils 6->5 millimeters bilaterally Throat: laceration on the right side of the tongue, consistent with a bite during seizure. no evidence of localized infection Lungs: clear to auscultation bilaterally Heart: regular rate and rhythm, S1, S2 normal, no murmur, click, rub or gallop Extremities: extremities normal, atraumatic, no cyanosis or edema Pulses: 2+ bilaterally Neurologic: EOMI, PERRL, grip 5/5 bilaterally, 5/5 lower extremity strength Lab Results: Basic Metabolic Panel:  Lab 08/19/12 1610 08/19/12 0447 08/18/12 1906  Henretta Quist -- 140 137  K -- 3.7 3.5  CL -- 104 102  CO2 -- 25 24  GLUCOSE -- 95 110*  BUN -- 14 13  CREATININE -- 0.96 0.85  CALCIUM -- 9.5 10.1  MG 2.0 -- --  PHOS -- -- --   Liver Function Tests:  Lab 08/19/12 0447 08/18/12 1906  AST 26 22  ALT 22 25  ALKPHOS 58 61  BILITOT 0.7 0.4  PROT 6.8 7.7  ALBUMIN 3.6 4.2   CBC:  Lab 08/20/12 1109 08/19/12 0447  WBC 13.0* 19.1*  NEUTROABS 8.0* 13.2*  HGB 14.9 15.6  HCT 42.8 44.2  MCV 94.3 94.2  PLT 270 274   Thyroid Function Tests:  Lab 08/19/12 1457  TSH 0.638  T4TOTAL --  FREET4 --  T3FREE --  THYROIDAB --   Urine Drug Screen: Drugs of Abuse     Component Value Date/Time   LABOPIA  NONE DETECTED 08/19/2012 1044   COCAINSCRNUR NONE DETECTED 08/19/2012 1044   LABBENZ POSITIVE* 08/19/2012 1044   AMPHETMU NONE DETECTED 08/19/2012 1044   THCU NONE DETECTED 08/19/2012 1044   LABBARB NONE DETECTED 08/19/2012 1044    Urinalysis:  Lab 08/18/12 1928  COLORURINE YELLOW  LABSPEC 1.022  PHURINE 5.5  GLUCOSEU NEGATIVE  HGBUR NEGATIVE  BILIRUBINUR NEGATIVE  KETONESUR NEGATIVE  PROTEINUR NEGATIVE  UROBILINOGEN 0.2  NITRITE NEGATIVE  LEUKOCYTESUR NEGATIVE     Medications: I have reviewed the patient's current medications. Scheduled Meds:   . antiseptic oral rinse  15 mL Mouth Rinse q12n4p  . chlorhexidine  15 mL Mouth Rinse BID  . clonazePAM  0.5 mg Oral BID  . enoxaparin (LOVENOX) injection  40 mg Subcutaneous Q24H  . levetiracetam  500 mg Intravenous Once  . levETIRAcetam  500 mg Oral BID  . methadone  25 mg Oral Daily  . sertraline  100 mg Oral Daily  . sodium chloride  3 mL Intravenous Q12H  . vitamin C  500 mg Oral Daily   Continuous Infusions:   . DISCONTD: sodium chloride 100 mL/hr at 08/19/12 1251   PRN Meds:.acetaminophen, bismuth subsalicylate, hyoscyamine, ibuprofen, lidocaine, promethazine, DISCONTD: LORazepam, DISCONTD: LORazepam, DISCONTD: LORazepam Assessment/Plan: **SEIZURE: Seizure likely due to benzodiazepine withdrawal with a predisposition for seizures  given encephalomalacia. Neurology consulted yesterday and have started Keppra for seizure prophylaxis. Has had no other seizure-like activity since admission. CIWA protocol given initially for benzo dependence  Currently on Clonazepam BID, and received 4 doses of Ativan yesterday per protocol, but likely received it for anxiousness and dependence instead of frank withdrawal symptoms given maximum score of 9. --Continue clonazepam BID --D/C CIWA protocol. --Continue Keppra 500 BID, will discharge on this.   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 Is doing well  today. Social work consulted and is working on [employer], likely Risk manager vs. Intensive outpatient Insurance account manager. Patient and wife prefer inpatient detox; we are awaiting info from [facility] about admission proceedings (they will provide Librium taper if necessary, but will not continue previously prescribed benzodiazepines). --Continue clonazepam BID --D/C CIWA protocol --Social Work following, will await more info about dispo.  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 He was trying to detox himself using methadone. We have called the methadone clinic and they verified that he saw them for the first time on 8/30 and last saw them on 9/2. He was most recently on 25mg  qd of methadone. --Continue 25mg  tablets methadone daily.  **LEUKOCYTOSIS: The patient had white count of 18.9 on admission, is now 13.0. He has been afebrile. CXR unremarkable for aspiration. Urinalysis unremarkable. Likely secondary to seizure.  **Anxiety/Depression --Continue home zoloft   --Is on clonazepam as above.  **DVT prophylaxis: Lovenox  **DISPO: --Will likely discharge tomorrow to inpatient detox vs home with intensive outpatient follow-up.     LOS: 2 days   This is a Psychologist, occupational Note.  The care of the patient was discussed with Dr. Dierdre Searles and the assessment and plan formulated with their assistance.  Please see their attached note for official documentation of the daily encounter.  Warren Lacy 08/20/2012, 1:45 PM  Resident Co-sign Daily Note: I have seen the patient and reviewed the daily progress note by MS 4 Rozanna Boer and discussed the care of the patient with them.  See below for documentation of my findings, assessment, and plans.  Subjective:  Feels ok. States that Lidocaine works for his soreness on his tongue from the tongue biting 2/2 seizure.  Objective: Vital signs in last 24 hours: Filed Vitals:   08/19/12 2200  08/20/12 0600 08/20/12 1417 08/20/12 2145  BP: 118/77 121/69 112/68 127/76  Pulse: 96 80 93 88  Temp: 98.3 F (36.8 C) 97.3 F (36.3 C) 98 F (36.7 C) 97.3 F (36.3 C)  TempSrc: Oral Oral Oral Oral  Resp: 18 18 18 20   Height:      Weight:      SpO2: 97% 98% 94% 97%   Physical Exam: General: NAD Tongue: right lateral soreness noted. No signs of active bleeding or infection Lungs: CTA B/L Heart: RRR. No M/G/R Ext: No edema Neuro: no focal neuro deficit  Lab Results: Reviewed and documented in Electronic Record Micro Results: Reviewed and documented in Electronic Record Studies/Results: Reviewed and documented in Electronic Record Medications: I have reviewed the patient's current medications. Scheduled Meds:   . antiseptic oral rinse  15 mL Mouth Rinse q12n4p  . chlorhexidine  15 mL Mouth Rinse BID  . clonazePAM  0.5 mg Oral BID  . enoxaparin (LOVENOX) injection  40 mg Subcutaneous Q24H  . levETIRAcetam  500 mg Oral BID  . methadone  25 mg Oral Daily  . sertraline  100 mg Oral Daily  .  sodium chloride  3 mL Intravenous Q12H  . vitamin C  500 mg Oral Daily   Continuous Infusions:  PRN Meds:.acetaminophen, bismuth subsalicylate, hyoscyamine, ibuprofen, lidocaine, promethazine, DISCONTD: LORazepam, DISCONTD: LORazepam, DISCONTD: LORazepam Assessment/Plan:  1. Seizure  First time seizure. The etiology is unclear. May be related to his TBI vs Benzo withdrawal.  - will consult neurology>>>start Keppra  -continue Clonazepam BID - No PRN Benzo!!  2. Benzo dependence  - SW for detox >>may transfer to treatment center vs discharge home and admitted to treatment center.  3. OPIOID DEPENDENCE  Methadone clinic contacted and states that he is on methadone 25 mg po daily   4. Depression  Not depressed, no SI/HI  - continue home dose.   VTE: Lovenox.  Dispo: likely D/C in am    LOS: 2 days   Zacari Radick 08/20/2012, 02:15 PM

## 2012-08-21 MED ORDER — LEVETIRACETAM 500 MG PO TABS: 500.0000 mg | ORAL_TABLET | Freq: Two times a day (BID) | ORAL | Status: DC

## 2012-08-21 NOTE — Progress Notes (Signed)
Pt given discharge instructions, and RX.  Wife is on her way to get him, no complaints at this time.
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
## 2012-08-21 NOTE — Progress Notes (Signed)
Medical Student Daily Progress Note  Subjective: No acute events overnight. Patient feels well this morning, has no complaints. Objective: Vital signs in last 24 hours: Filed Vitals:   08/20/12 0600 08/20/12 1417 08/20/12 2145 08/21/12 1018  BP: 121/69 112/68 127/76 112/74  Pulse: 80 93 88 97  Temp: 97.3 F (36.3 C) 98 F (36.7 C) 97.3 F (36.3 C) 98.3 F (36.8 C)  TempSrc: Oral Oral Oral   Resp: 18 18 20 20   Height:      Weight:      SpO2: 98% 94% 97% 100%   Weight change:   Intake/Output Summary (Last 24 hours) at 08/21/12 1344 Last data filed at 08/21/12 1018  Gross per 24 hour  Intake    240 ml  Output      0 ml  Net    240 ml   Physical Exam: General appearance: alert, cooperative and no distress Eyes: conjunctivae/corneas clear. PERRL, EOM's intact.  Lungs: clear to auscultation bilaterally Heart: regular rate and rhythm, S1, S2 normal, no murmur, click, rub or gallop Extremities: no edema Pulses:2+ bilaterally Lab Results: Basic Metabolic Panel:  Lab 08/19/12 9562 08/19/12 0447 08/18/12 1906  Marven Veley -- 140 137  K -- 3.7 3.5  CL -- 104 102  CO2 -- 25 24  GLUCOSE -- 95 110*  BUN -- 14 13  CREATININE -- 0.96 0.85  CALCIUM -- 9.5 10.1  MG 2.0 -- --  PHOS -- -- --    CBC:  Lab 08/20/12 1109 08/19/12 0447  WBC 13.0* 19.1*  NEUTROABS 8.0* 13.2*  HGB 14.9 15.6  HCT 42.8 44.2  MCV 94.3 94.2  PLT 270 274    Medications: I have reviewed the patient's current medications. Scheduled Meds:   . antiseptic oral rinse  15 mL Mouth Rinse q12n4p  . chlorhexidine  15 mL Mouth Rinse BID  . clonazePAM  0.5 mg Oral BID  . enoxaparin (LOVENOX) injection  40 mg Subcutaneous Q24H  . levETIRAcetam  500 mg Oral BID  . methadone  25 mg Oral Daily  . sertraline  100 mg Oral Daily  . sodium chloride  3 mL Intravenous Q12H  . vitamin C  500 mg Oral Daily   Continuous Infusions:  PRN Meds:.acetaminophen, bismuth subsalicylate, hyoscyamine, ibuprofen, lidocaine,  promethazine Assessment/Plan: Principal Problem:  *Seizure Active Problems:  Benzodiazepine dependence  **SEIZURE: Seizure likely due to benzodiazepine withdrawal with a predisposition for seizures given encephalomalacia. On Keppra and Clonazepam BID --Continue Keppra 500 BID, will discharge on this.  --[facility]. They will further assess his need for benzodiazepines and monitor withdrawal status.  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 Is doing well today.  --Discharge to [facility] as above. (they will provide Librium taper if necessary, but will not continue previously prescribed benzodiazepines).   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 He was trying to detox himself using methadone. We have called the methadone clinic and they verified that he saw them for the first time on 8/30 and last saw them on 9/2. He was most recently on 25mg  qd of methadone.  --Need for methadone will be assessed during planned detox.  **LEUKOCYTOSIS: The patient had white count of 18.9 on admission,and it decreased to 13.0. He has been afebrile. CXR unremarkable for aspiration. Urinalysis unremarkable. Likely secondary to seizure.   **Anxiety/Depression  --On home Zoloft  **Dispo: Discharge today. Wife will pick the patient  up and bring him to admission appointment at [facility].   LOS: 3 days   This is a Psychologist, occupational Note.  The care of the patient was discussed with Dr. Dierdre Searles and the assessment and plan formulated with their assistance.  Please see their attached note for official documentation of the daily encounter.  Warren Lacy 08/21/2012, 1:44 PM  Resident Co-sign Daily Note: I have seen the patient and reviewed the daily progress note by MS [facility] and discussed the care of the patient with them.  See below for documentation of my  findings, assessment, and plans.  Subjective: Doing ok. No c/o.  Objective: Vital signs in last 24 hours: Filed Vitals:   08/20/12 1417 08/20/12 2145 08/21/12 1018 08/21/12 1415  BP: 112/68 127/76 112/74 109/62  Pulse: 93 88 97 78  Temp: 98 F (36.7 C) 97.3 F (36.3 C) 98.3 F (36.8 C) 98.1 F (36.7 C)  TempSrc: Oral Oral    Resp: 18 20 20 20   Height:      Weight:      SpO2: 94% 97% 100% 98%   Physical Exam: General appearance: alert, cooperative and no distress  Eyes: conjunctivae/corneas clear. PERRL, EOM's intact.  Lungs: clear to auscultation bilaterally  Heart: regular rate and rhythm, S1, S2 normal, no murmur, click, rub or gallop  Extremities: no edema  Pulses:2+ bilaterally  Lab Results: Reviewed and documented in Electronic Record Micro Results: Reviewed and documented in Electronic Record Studies/Results: Reviewed and documented in Electronic Record Medications: I have reviewed the patient's current medications. Scheduled Meds:   Continuous Infusions:   PRN Meds:.   Assessment/Plan:  Patient is stable today. Ready to be discharged.    LOS: 3 days   Nikyla Navedo 08/21/2012, 3:39 PM

## 2012-08-21 NOTE — Discharge Summary (Signed)
Internal medicine teaching service attending Dr.Nicolaus Andel. Agree with the documented discharge summary.

## 2012-08-21 NOTE — Progress Notes (Signed)
Clinical Social Work-CSW contacted Risk manager with regards to acceptance-Pt has been medically accepted and has intake appointment at 4pm. Admissions coordinator will be contacting CSW with regards to insurance co-pays so that family is prepared with financial deposit- CSW notified pt wife who will be arriving at 3pm to d/c pt and transport to [facility]- CSW has notified treatment team and will f/u with and d/c needs as they arise today- Jodean Lima, [phone]

## 2012-11-29 ENCOUNTER — Emergency Department (HOSPITAL_BASED_OUTPATIENT_CLINIC_OR_DEPARTMENT_OTHER): Payer: BC Managed Care – PPO

## 2012-11-29 ENCOUNTER — Emergency Department (HOSPITAL_BASED_OUTPATIENT_CLINIC_OR_DEPARTMENT_OTHER)
Admission: EM | Admit: 2012-11-29 | Discharge: 2012-11-29 | Disposition: A | Discharge status: Home / Self Care | Payer: BC Managed Care – PPO | Attending: Emergency Medicine | Admitting: Emergency Medicine

## 2012-11-29 ENCOUNTER — Encounter (HOSPITAL_BASED_OUTPATIENT_CLINIC_OR_DEPARTMENT_OTHER): Payer: Self-pay | Admitting: *Deleted

## 2012-11-29 DIAGNOSIS — Z8719 Personal history of other diseases of the digestive system: Secondary | ICD-10-CM | POA: Insufficient documentation

## 2012-11-29 DIAGNOSIS — W230XXA Caught, crushed, jammed, or pinched between moving objects, initial encounter: Secondary | ICD-10-CM | POA: Insufficient documentation

## 2012-11-29 DIAGNOSIS — G8929 Other chronic pain: Secondary | ICD-10-CM | POA: Insufficient documentation

## 2012-11-29 DIAGNOSIS — S6990XA Unspecified injury of unspecified wrist, hand and finger(s), initial encounter: Secondary | ICD-10-CM | POA: Insufficient documentation

## 2012-11-29 DIAGNOSIS — M545 Low back pain, unspecified: Secondary | ICD-10-CM | POA: Insufficient documentation

## 2012-11-29 DIAGNOSIS — M79642 Pain in left hand: Secondary | ICD-10-CM

## 2012-11-29 DIAGNOSIS — Z23 Encounter for immunization: Secondary | ICD-10-CM | POA: Insufficient documentation

## 2012-11-29 DIAGNOSIS — F3289 Other specified depressive episodes: Secondary | ICD-10-CM | POA: Insufficient documentation

## 2012-11-29 DIAGNOSIS — G40909 Epilepsy, unspecified, not intractable, without status epilepticus: Secondary | ICD-10-CM | POA: Insufficient documentation

## 2012-11-29 DIAGNOSIS — Y929 Unspecified place or not applicable: Secondary | ICD-10-CM | POA: Insufficient documentation

## 2012-11-29 DIAGNOSIS — M2550 Pain in unspecified joint: Secondary | ICD-10-CM | POA: Insufficient documentation

## 2012-11-29 DIAGNOSIS — Z87820 Personal history of traumatic brain injury: Secondary | ICD-10-CM | POA: Insufficient documentation

## 2012-11-29 DIAGNOSIS — Y9389 Activity, other specified: Secondary | ICD-10-CM | POA: Insufficient documentation

## 2012-11-29 DIAGNOSIS — F17200 Nicotine dependence, unspecified, uncomplicated: Secondary | ICD-10-CM | POA: Insufficient documentation

## 2012-11-29 DIAGNOSIS — F329 Major depressive disorder, single episode, unspecified: Secondary | ICD-10-CM | POA: Insufficient documentation

## 2012-11-29 DIAGNOSIS — K219 Gastro-esophageal reflux disease without esophagitis: Secondary | ICD-10-CM | POA: Insufficient documentation

## 2012-11-29 DIAGNOSIS — M4120 Other idiopathic scoliosis, site unspecified: Secondary | ICD-10-CM | POA: Insufficient documentation

## 2012-11-29 DIAGNOSIS — F411 Generalized anxiety disorder: Secondary | ICD-10-CM | POA: Insufficient documentation

## 2012-11-29 MED ORDER — CLINDAMYCIN HCL 150 MG PO CAPS: 300.0000 mg | ORAL_CAPSULE | Freq: Three times a day (TID) | ORAL | Status: DC

## 2012-11-29 MED ORDER — CEPHALEXIN 250 MG PO CAPS
500.0000 mg | ORAL_CAPSULE | Freq: Once | ORAL | Status: AC
  Administered 2012-11-29: 500 mg via ORAL
  Filled 2012-11-29: qty 2

## 2012-11-29 MED ORDER — TETANUS-DIPHTH-ACELL PERTUSSIS 5-2.5-18.5 LF-MCG/0.5 IM SUSP
0.5000 mL | Freq: Once | INTRAMUSCULAR | Status: AC
  Administered 2012-11-29: 0.5 mL via INTRAMUSCULAR
  Filled 2012-11-29: qty 0.5

## 2012-11-29 MED ORDER — SULFAMETHOXAZOLE-TMP DS 800-160 MG PO TABS
1.0000 | ORAL_TABLET | Freq: Once | ORAL | Status: AC
  Administered 2012-11-29: 1 via ORAL
  Filled 2012-11-29: qty 1

## 2012-11-29 MED ORDER — TRAMADOL HCL 50 MG PO TABS: 50.0000 mg | ORAL_TABLET | Freq: Four times a day (QID) | ORAL | Status: DC | PRN

## 2012-11-29 MED ORDER — HYDROCODONE-ACETAMINOPHEN 5-325 MG PO TABS: 1.0000 | ORAL_TABLET | ORAL | Status: DC | PRN

## 2012-11-29 MED ORDER — DOXYCYCLINE HYCLATE 100 MG PO CAPS: 100.0000 mg | ORAL_CAPSULE | Freq: Two times a day (BID) | ORAL | Status: DC
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
## 2012-11-30 NOTE — ED Provider Notes (Signed)
Medical screening examination/treatment/procedure(s) were performed by non-physician practitioner and as supervising physician I was immediately available for consultation/collaboration.   Joya Gaskins, MD 11/30/12 [phone]

## 2013-06-25 ENCOUNTER — Telehealth: Payer: Self-pay | Admitting: Neurology

## 2013-06-25 NOTE — Telephone Encounter (Signed)
Called patient and left message we got his message. We will follow with Doctor and see if she wants to give patient something for Anxiety. Please advise

## 2013-06-25 NOTE — Telephone Encounter (Signed)
Will evaluate patient first before provide any prescription

## 2013-06-26 NOTE — Telephone Encounter (Signed)
I called and LMVM for pt on home # that Dr. Terrace Arabia would like to see pt on appt prior to prescribing this medication.

## 2013-07-02 ENCOUNTER — Encounter: Payer: Self-pay | Admitting: Neurology

## 2013-07-02 DIAGNOSIS — R413 Other amnesia: Secondary | ICD-10-CM

## 2013-07-02 DIAGNOSIS — G43909 Migraine, unspecified, not intractable, without status migrainosus: Secondary | ICD-10-CM

## 2013-07-02 DIAGNOSIS — F419 Anxiety disorder, unspecified: Secondary | ICD-10-CM | POA: Insufficient documentation

## 2013-07-02 DIAGNOSIS — R569 Unspecified convulsions: Secondary | ICD-10-CM

## 2013-07-03 ENCOUNTER — Ambulatory Visit (INDEPENDENT_AMBULATORY_CARE_PROVIDER_SITE_OTHER): Payer: BC Managed Care – PPO | Admitting: Neurology

## 2013-07-03 ENCOUNTER — Encounter: Payer: Self-pay | Admitting: Neurology

## 2013-07-03 VITALS — BP 102/65 | HR 78 | Ht 67.0 in | Wt 230.0 lb

## 2013-07-03 DIAGNOSIS — S069X9A Unspecified intracranial injury with loss of consciousness of unspecified duration, initial encounter: Secondary | ICD-10-CM

## 2013-07-03 DIAGNOSIS — S069X0A Unspecified intracranial injury without loss of consciousness, initial encounter: Secondary | ICD-10-CM

## 2013-07-03 NOTE — Patient Instructions (Signed)
[facility] [address] [place], [place] [address]  Phone: [phone]  Fax: [phone]
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
## 2013-08-03 ENCOUNTER — Telehealth: Payer: Self-pay | Admitting: Neurology

## 2013-08-03 DIAGNOSIS — M549 Dorsalgia, unspecified: Secondary | ICD-10-CM

## 2013-08-03 DIAGNOSIS — R413 Other amnesia: Secondary | ICD-10-CM

## 2013-08-03 DIAGNOSIS — F329 Major depressive disorder, single episode, unspecified: Secondary | ICD-10-CM

## 2013-08-03 DIAGNOSIS — R569 Unspecified convulsions: Secondary | ICD-10-CM

## 2013-08-03 NOTE — Telephone Encounter (Signed)
Chart reviewed, I will refer him to psychiatrist, but I do not know anybody in specific, please call patient to verify the preferred provider.

## 2013-08-13 ENCOUNTER — Ambulatory Visit: Payer: Self-pay | Admitting: Neurology

## 2013-09-20 ENCOUNTER — Other Ambulatory Visit: Payer: Self-pay

## 2013-09-20 MED ORDER — SERTRALINE HCL 100 MG PO TABS: 100.0000 mg | ORAL_TABLET | Freq: Every day | ORAL | Status: DC

## 2013-09-20 MED ORDER — TOPIRAMATE 100 MG PO TABS: 100.0000 mg | ORAL_TABLET | Freq: Two times a day (BID) | ORAL | Status: DC

## 2013-09-20 MED ORDER — TRAZODONE HCL 100 MG PO TABS: 100.0000 mg | ORAL_TABLET | Freq: Every day | ORAL | Status: DC

## 2014-05-24 ENCOUNTER — Other Ambulatory Visit: Payer: Self-pay | Admitting: Neurology

## 2014-05-24 ENCOUNTER — Telehealth: Payer: Self-pay | Admitting: *Deleted

## 2014-05-27 NOTE — Telephone Encounter (Signed)
Called pt and left message to call our office. Back.

## 2014-05-31 NOTE — Telephone Encounter (Signed)
I called the pharmacy.  They said the medication does not require a prior auth, the patient was just in need of a refill auth, which he has already picked up.   (I have been out of the office since 06/06)

## 2014-07-02 ENCOUNTER — Encounter (INDEPENDENT_AMBULATORY_CARE_PROVIDER_SITE_OTHER): Payer: Self-pay

## 2014-07-02 ENCOUNTER — Ambulatory Visit (INDEPENDENT_AMBULATORY_CARE_PROVIDER_SITE_OTHER): Payer: BC Managed Care – PPO | Admitting: Neurology

## 2014-07-02 ENCOUNTER — Encounter: Payer: Self-pay | Admitting: Neurology

## 2014-07-02 VITALS — BP 107/72 | HR 84 | Ht 67.0 in | Wt 175.0 lb

## 2014-07-02 DIAGNOSIS — R569 Unspecified convulsions: Secondary | ICD-10-CM

## 2014-07-02 MED ORDER — TOPIRAMATE 100 MG PO TABS: 100.0000 mg | ORAL_TABLET | Freq: Two times a day (BID) | ORAL | Status: DC
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
## 2015-07-23 ENCOUNTER — Other Ambulatory Visit: Payer: Self-pay | Admitting: Neurology

## 2015-07-23 NOTE — Telephone Encounter (Signed)
Last OV says: Continue topamax  bid, May follow up with his primary care for refill

## 2015-08-22 ENCOUNTER — Other Ambulatory Visit: Payer: Self-pay | Admitting: Neurology

## 2015-09-14 ENCOUNTER — Other Ambulatory Visit: Payer: Self-pay | Admitting: Neurology

## 2015-09-16 ENCOUNTER — Other Ambulatory Visit: Payer: Self-pay | Admitting: Neurology

## 2015-09-20 ENCOUNTER — Telehealth: Payer: Self-pay | Admitting: Neurology

## 2015-09-20 MED ORDER — TOPIRAMATE 100 MG PO TABS: ORAL_TABLET | ORAL | Status: DC

## 2015-09-20 NOTE — Telephone Encounter (Signed)
Rx has been sent to last until appt.  Receipt confirmed by pharmacy.  I called back to advise.  He is aware.

## 2015-09-20 NOTE — Telephone Encounter (Signed)
Pt called in about refill for topiramate (TOPAMAX) 100 MG tablet, pt was told he needed to make appt for more refills. He is scheduled for Nov. 7th. The Rx for topiramate (TOPAMAX) 100 MG tablet will not last until that appt date. Please call and advise [phone]

## 2015-10-24 ENCOUNTER — Ambulatory Visit: Payer: Self-pay | Admitting: Neurology

## 2015-10-24 ENCOUNTER — Ambulatory Visit (INDEPENDENT_AMBULATORY_CARE_PROVIDER_SITE_OTHER): Payer: Medicare Other | Admitting: Neurology

## 2015-10-24 ENCOUNTER — Encounter: Payer: Self-pay | Admitting: Neurology

## 2015-10-24 VITALS — BP 106/63 | HR 77 | Ht 67.0 in | Wt 197.0 lb

## 2015-10-24 DIAGNOSIS — G5603 Carpal tunnel syndrome, bilateral upper limbs: Secondary | ICD-10-CM | POA: Diagnosis not present

## 2015-10-24 DIAGNOSIS — R569 Unspecified convulsions: Secondary | ICD-10-CM

## 2015-10-24 MED ORDER — TOPIRAMATE 100 MG PO TABS: ORAL_TABLET | ORAL | Status: DC
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
## 2015-11-14 ENCOUNTER — Ambulatory Visit (INDEPENDENT_AMBULATORY_CARE_PROVIDER_SITE_OTHER): Payer: 59 | Admitting: Licensed Clinical Social Worker

## 2015-11-14 ENCOUNTER — Encounter (HOSPITAL_COMMUNITY): Payer: Self-pay | Admitting: Licensed Clinical Social Worker

## 2015-11-14 DIAGNOSIS — F330 Major depressive disorder, recurrent, mild: Secondary | ICD-10-CM | POA: Diagnosis not present

## 2015-11-14 DIAGNOSIS — F411 Generalized anxiety disorder: Secondary | ICD-10-CM | POA: Insufficient documentation

## 2015-11-14 DIAGNOSIS — F1120 Opioid dependence, uncomplicated: Secondary | ICD-10-CM | POA: Diagnosis not present

## 2015-11-14 DIAGNOSIS — F339 Major depressive disorder, recurrent, unspecified: Secondary | ICD-10-CM | POA: Insufficient documentation
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
## 2015-11-21 ENCOUNTER — Ambulatory Visit (INDEPENDENT_AMBULATORY_CARE_PROVIDER_SITE_OTHER): Payer: 59 | Admitting: Licensed Clinical Social Worker

## 2015-11-21 ENCOUNTER — Encounter (HOSPITAL_COMMUNITY): Payer: Self-pay | Admitting: Licensed Clinical Social Worker

## 2015-11-21 DIAGNOSIS — F1120 Opioid dependence, uncomplicated: Secondary | ICD-10-CM | POA: Diagnosis not present

## 2015-11-21 DIAGNOSIS — F411 Generalized anxiety disorder: Secondary | ICD-10-CM | POA: Diagnosis not present

## 2015-11-21 DIAGNOSIS — F330 Major depressive disorder, recurrent, mild: Secondary | ICD-10-CM

## 2015-11-21 NOTE — Progress Notes (Signed)
.    Individual Progress Note  Session Time: 10:30 AM-11:00 AM   Participation Level:  Active  Behavioral Response:  AlertSharing  Type of Therapy: Individual Therapy  Treatment Goals Addressed: Anxiety  Modes of Intervention:  Education and Motivational, Coping Skills  Additional Comments:  He reports doing good. He discussed treatment goals with therapist and feels that if his anxiety improves that it will improve his functioning overall. He described things such as better sleep, less irritability and less arguments with his wife. He said that healthier communication strategies, stress management and learning coping skills for anxiety would be helpful. Therapist discussed goal of addressing substance abuse and working on abstinence. Patient does not feel he wants to work on this right now. Therapist provided  education on the dangerousness of mixing benzodiazepines with opiates and that Suboxone is partial agonist which means that it is partial opiate. He said that he did research on Suboxone. Patient said that he rather smoke marijuana than use benzodiazepines and that the last time he smoked was two weeks ago.   Therapist Response: Therapist use motivational strategies and education to motivate patient to work toward goal of abstinence. Therapist pointed out that it is not only dangerous to combine substances but that usage can have the opposite effect of increasing symptoms such as anxiety. One can better address symptoms by applying healthier coping strategies. At this point when patient is on maintenance therapy, a harm reduction model may be more effective. Patient at this point is more motivated to work on improvement of anxiety and therapist initiated discussion of helpful approaches to work on this with patient such as CBT, DBT and coping skills.  Homicidal/Suicidal: no  Plan: 1.Therapist continue to use motivational strategies to increase patient to use healthy coping skills to manage  symptoms.2.Patient learn and implement healthy coping strategies to manage anxiety and depression.                  Zayli Villafuerte A

## 2015-11-22 ENCOUNTER — Ambulatory Visit (HOSPITAL_COMMUNITY): Payer: Self-pay | Admitting: Clinical

## 2015-11-28 ENCOUNTER — Encounter (HOSPITAL_COMMUNITY): Payer: Self-pay

## 2015-11-28 ENCOUNTER — Encounter (HOSPITAL_COMMUNITY): Payer: Self-pay | Admitting: Psychiatry

## 2015-11-28 ENCOUNTER — Ambulatory Visit (INDEPENDENT_AMBULATORY_CARE_PROVIDER_SITE_OTHER): Payer: 59 | Admitting: Psychiatry

## 2015-11-28 VITALS — BP 108/68 | HR 74 | Ht 67.0 in | Wt 195.2 lb

## 2015-11-28 DIAGNOSIS — F411 Generalized anxiety disorder: Secondary | ICD-10-CM

## 2015-11-28 DIAGNOSIS — F1120 Opioid dependence, uncomplicated: Secondary | ICD-10-CM | POA: Diagnosis not present

## 2015-11-28 DIAGNOSIS — F341 Dysthymic disorder: Secondary | ICD-10-CM | POA: Insufficient documentation

## 2015-11-28 DIAGNOSIS — S069X0D Unspecified intracranial injury without loss of consciousness, subsequent encounter: Secondary | ICD-10-CM | POA: Diagnosis not present

## 2015-11-28 DIAGNOSIS — F0630 Mood disorder due to known physiological condition, unspecified: Secondary | ICD-10-CM

## 2015-11-28 DIAGNOSIS — F331 Major depressive disorder, recurrent, moderate: Secondary | ICD-10-CM

## 2015-11-28 MED ORDER — LAMOTRIGINE 150 MG PO TABS: 150.0000 mg | ORAL_TABLET | Freq: Every day | ORAL | Status: DC

## 2015-11-28 MED ORDER — DIVALPROEX SODIUM 500 MG PO DR TAB: 500.0000 mg | DELAYED_RELEASE_TABLET | Freq: Two times a day (BID) | ORAL | Status: DC

## 2015-11-28 MED ORDER — HYDROXYZINE PAMOATE 50 MG PO CAPS: 50.0000 mg | ORAL_CAPSULE | Freq: Three times a day (TID) | ORAL | Status: DC | PRN

## 2015-11-28 MED ORDER — BUPRENORPHINE HCL-NALOXONE HCL 8-2 MG SL SUBL: 1.0000 | SUBLINGUAL_TABLET | Freq: Every day | SUBLINGUAL | Status: DC
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
## 2015-12-01 ENCOUNTER — Ambulatory Visit (HOSPITAL_COMMUNITY): Payer: Self-pay | Admitting: Psychiatry

## 2015-12-05 ENCOUNTER — Ambulatory Visit (INDEPENDENT_AMBULATORY_CARE_PROVIDER_SITE_OTHER): Payer: 59 | Admitting: Licensed Clinical Social Worker

## 2015-12-05 ENCOUNTER — Encounter (HOSPITAL_COMMUNITY): Payer: Self-pay | Admitting: Licensed Clinical Social Worker

## 2015-12-05 DIAGNOSIS — S069X0D Unspecified intracranial injury without loss of consciousness, subsequent encounter: Secondary | ICD-10-CM

## 2015-12-05 DIAGNOSIS — F1120 Opioid dependence, uncomplicated: Secondary | ICD-10-CM | POA: Diagnosis not present

## 2015-12-05 DIAGNOSIS — F331 Major depressive disorder, recurrent, moderate: Secondary | ICD-10-CM

## 2015-12-05 DIAGNOSIS — F411 Generalized anxiety disorder: Secondary | ICD-10-CM

## 2015-12-05 NOTE — Progress Notes (Signed)
THERAPIST PROGRESS NOTE  Session Time: 9:05 AM-9:55 AM  Participation Level: Active  Behavioral Response: CasualAlertEuthymic  Type of Therapy: Individual Therapy  Treatment Goals addressed: Anger, Anxiety, Communication, Diagnosis: MDD, Genearlized Anxiety Disorder, Opiate Dependence, Coping and Diagnosis:  Interventions: Motivational Interviewing, Strength-based, Supportive, Anger Management Training and Other: Coping Skills  Summary: Shannon Pace is a 39 y.o. male who relates that he is "doing all right". He is letting things go, biting his tongue and not acting out.  He describes arguing with his wife. She lets the anger built up. He walks ways and she calms down. He doesn't want to explode so he walks away. He said that this has to with the brain injury. He can get mad really fast so he takes medicine. Therapist pointed out that using coping skills with medicine it an effective way to manage irritability. He said that Suboxone is helping. He doesn't have the craving so it helps with irritability. He has been on Suboxone for about 31/2 months. He said that some days are better. He said that medicine helps with stress and irritability. He related that he has anxiety all the time. He said Dr. Soundra Pilon said that it comes along with the brain injury. It feels like frustration in his mind. He describes this feeling that if he worries about something down the road then the anxiety will kick in. If something happened recently the anxiety will be right there. Even if there is nothing he can do about it the anxiety will increase. He gets worried easily. He said he didn't use to be like that. He said that hunting helps to calm down and fishing. He can hunt from October to December and fishing spring to summer fishing. 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 Therapist related that therapy will help introduce him to coping skills for anxiety and he can pick the ones that are most  effective. Therapist introduced patient to relaxation and discuss doing relaxation exercises. We discussed his benzodiazepine dependence, he realizes how high risk they are and he has seized coming off of them. We discussed his past diagnosis of benzodiazepine dependence. He had no knowledge of them or wouldn't have done them. He also has been addicted to opiates but not benzodiazepine. The only reason he took the benzo was that he didn't have the opiates. He has such a high tolerance is the reason he took so many in about a week. This was when he abused them and the only other way he was taking them was when he was prescribed them. He discussed his past substance abuse. He was a numb for the years he took the substances. He hates that he missed the years. He has problems now but he can remember things and not like it was before. He was numb 24/7 and it was not a good way to live. We talked about NA and he said there was drama. He enjoyed it but far away from his house and it is not over until 9:00 at night. He used to go every night. He said as long as he takes Suboxone he doesn't need anything else.He still feels that pot helps anxiety but he can't smoke when he is on clinic.   Suicidal/Homicidal: No  Therapist Response: Therapist reinforced patient's healthy coping with anger by walking away when arguing with his wife. Therapist said that it would also be helpful to work on effective interpersonal skills to manage and this is something to go  into depth with him to help in handling conflict with his wife.  Patient needs to work on managing anxiety. In this session therapist discussed relaxation exercises as a foundation for coping skills to manage anxiety. She discussed how it helps a patient to be put in a relaxed state, that it is effective if practiced regularly and described different types of relaxation exercises. Therapist will continue to introduce patient to coping sills for anxiety so he can choose  ones that will be effective for him. Patient was given a handout to explain relaxation and a visualization exercise. Therapist use motivational therapy to build on patient's motivation to stay clean. Therapist provided supportive interventions. herapist provided education in discussing problems with benzodiazepine usage. Therapist will continue to use motivational strategies to work toward abstinence.   Plan: 1.Return again in 2 weeks.2.Patient read handout out on relaxation and visualization exercise.3.Patient implement and learn coping skills to manage anger and anxiety in daily life.   Diagnosis: Axis I: Generalized Anxiety Disorder and Major Depression, Recurrent severe, Opiate Dependence        Chapel Silverthorn A 12/05/2015

## 2015-12-28 ENCOUNTER — Ambulatory Visit (HOSPITAL_COMMUNITY): Payer: Self-pay | Admitting: Psychiatry

## 2015-12-28 ENCOUNTER — Encounter: Payer: Self-pay | Admitting: Gastroenterology

## 2015-12-29 ENCOUNTER — Ambulatory Visit (HOSPITAL_COMMUNITY): Payer: Self-pay | Admitting: Psychiatry

## 2016-01-02 ENCOUNTER — Ambulatory Visit (HOSPITAL_COMMUNITY): Payer: Self-pay | Admitting: Clinical

## 2016-01-05 ENCOUNTER — Ambulatory Visit (HOSPITAL_COMMUNITY): Payer: Self-pay | Admitting: Clinical

## 2016-01-05 ENCOUNTER — Ambulatory Visit (HOSPITAL_COMMUNITY): Payer: Self-pay | Admitting: Psychiatry

## 2016-01-13 MED FILL — SUBOXONE 8 MG-2 MG SL FILM: 8-2 | 7 days supply | Qty: 14 | Fill #0

## 2016-01-20 MED FILL — ZUBSOLV 5.7-1.4 MG TAB SL: 5.7-1.4 | 5 days supply | Qty: 15 | Fill #0

## 2016-01-27 MED FILL — SUBOXONE 8 MG-2 MG SL FILM: 8-2 | 7 days supply | Qty: 18 | Fill #0

## 2016-01-28 ENCOUNTER — Other Ambulatory Visit (HOSPITAL_COMMUNITY): Payer: Self-pay | Admitting: Psychiatry

## 2016-02-03 MED FILL — SUBOXONE 8 MG-2 MG SL FILM: 8-2 | 7 days supply | Qty: 18 | Fill #0

## 2016-02-06 ENCOUNTER — Other Ambulatory Visit (HOSPITAL_COMMUNITY): Payer: Self-pay | Admitting: Psychiatry

## 2016-02-06 ENCOUNTER — Telehealth (HOSPITAL_COMMUNITY): Payer: Self-pay

## 2016-02-06 NOTE — Telephone Encounter (Signed)
Patient is calling, he states that his pharmacy sent in a refill request and we have not responded. He needs a refill on Lamictal and Depakote. Patient is coming in on 2/24 for [facility]na nd was last seen in December. Okay to refill? Please advise, thank you.

## 2016-02-07 MED ORDER — DIVALPROEX SODIUM 500 MG PO DR TAB: 500.0000 mg | DELAYED_RELEASE_TABLET | Freq: Two times a day (BID) | ORAL | Status: DC

## 2016-02-07 MED ORDER — LAMOTRIGINE 150 MG PO TABS: 150.0000 mg | ORAL_TABLET | Freq: Every day | ORAL | Status: DC

## 2016-02-07 NOTE — Telephone Encounter (Signed)
Depakote in toes were reordered on 2 /21 / 17

## 2016-02-07 NOTE — Telephone Encounter (Signed)
I called the patient and let him know that the pharmacy has the rx.

## 2016-02-08 ENCOUNTER — Ambulatory Visit (HOSPITAL_COMMUNITY): Payer: Self-pay | Admitting: Clinical

## 2016-02-10 ENCOUNTER — Ambulatory Visit (HOSPITAL_COMMUNITY): Payer: 59 | Admitting: Psychiatry

## 2016-02-10 MED FILL — ZUBSOLV 5.7-1.4 MG TAB SL: 5.7-1.4 | 7 days supply | Qty: 21 | Fill #0

## 2016-02-13 ENCOUNTER — Telehealth: Payer: Self-pay | Admitting: Neurology

## 2016-02-13 NOTE — Telephone Encounter (Signed)
Spoke to patient - says he has noticed an increase in his headache frequency over the last two weeks (says he has good and bad days). He sometimes gets nausea with his more severe migraines. He is getting mild relief with OTC NSAIDS.  He is also concerned about his progressively worsening memory. He would like to be seen and he has been worked into the schedule this week.
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
## 2016-02-15 NOTE — Telephone Encounter (Signed)
error 

## 2016-02-16 ENCOUNTER — Encounter: Payer: Self-pay | Admitting: Neurology

## 2016-02-16 ENCOUNTER — Ambulatory Visit (INDEPENDENT_AMBULATORY_CARE_PROVIDER_SITE_OTHER): Payer: Medicare Other | Admitting: Neurology

## 2016-02-16 VITALS — BP 100/61 | HR 63 | Ht 67.0 in | Wt 193.0 lb

## 2016-02-16 DIAGNOSIS — R569 Unspecified convulsions: Secondary | ICD-10-CM

## 2016-02-16 DIAGNOSIS — G43009 Migraine without aura, not intractable, without status migrainosus: Secondary | ICD-10-CM | POA: Diagnosis not present

## 2016-02-16 DIAGNOSIS — F0630 Mood disorder due to known physiological condition, unspecified: Secondary | ICD-10-CM

## 2016-02-16 DIAGNOSIS — S069X0D Unspecified intracranial injury without loss of consciousness, subsequent encounter: Secondary | ICD-10-CM | POA: Diagnosis not present

## 2016-02-16 MED ORDER — RIZATRIPTAN BENZOATE 5 MG PO TBDP: 5.0000 mg | ORAL_TABLET | ORAL | Status: DC | PRN

## 2016-02-16 MED ORDER — LAMOTRIGINE 100 MG PO TABS: ORAL_TABLET | ORAL | Status: DC
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
## 2016-02-17 MED FILL — ZUBSOLV 5.7-1.4 MG TAB SL: 5.7-1.4 | 7 days supply | Qty: 21 | Fill #0

## 2016-02-24 MED FILL — ZUBSOLV 5.7-1.4 MG TAB SL: 5.7-1.4 | 14 days supply | Qty: 42 | Fill #0

## 2016-03-01 ENCOUNTER — Ambulatory Visit (HOSPITAL_COMMUNITY): Payer: 59 | Admitting: Psychiatry

## 2016-03-09 MED FILL — SUBOXONE 8 MG-2 MG SL FILM: 8-2 | 14 days supply | Qty: 42 | Fill #0

## 2016-03-15 ENCOUNTER — Encounter (HOSPITAL_COMMUNITY): Payer: Self-pay | Admitting: Psychiatry

## 2016-03-15 ENCOUNTER — Other Ambulatory Visit (HOSPITAL_COMMUNITY): Payer: Self-pay | Admitting: Psychiatry

## 2016-03-15 ENCOUNTER — Ambulatory Visit (INDEPENDENT_AMBULATORY_CARE_PROVIDER_SITE_OTHER): Payer: 59 | Admitting: Psychiatry

## 2016-03-15 VITALS — BP 104/65 | HR 64 | Ht 68.0 in | Wt 193.8 lb

## 2016-03-15 DIAGNOSIS — F0630 Mood disorder due to known physiological condition, unspecified: Secondary | ICD-10-CM

## 2016-03-15 DIAGNOSIS — F341 Dysthymic disorder: Secondary | ICD-10-CM | POA: Diagnosis not present

## 2016-03-15 MED ORDER — HYDROXYZINE PAMOATE 50 MG PO CAPS: 50.0000 mg | ORAL_CAPSULE | Freq: Three times a day (TID) | ORAL | Status: DC | PRN

## 2016-03-15 MED ORDER — DIVALPROEX SODIUM 500 MG PO DR TAB: 500.0000 mg | DELAYED_RELEASE_TABLET | Freq: Two times a day (BID) | ORAL | Status: DC
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
## 2016-03-23 MED FILL — SUBOXONE 8 MG-2 MG SL FILM: 8-2 | 28 days supply | Qty: 84 | Fill #0

## 2016-04-20 MED FILL — SUBOXONE 8 MG-2 MG SL FILM: 8-2 | 28 days supply | Qty: 84 | Fill #0

## 2016-05-18 MED FILL — SUBOXONE 8 MG-2 MG SL FILM: 8-2 | 28 days supply | Qty: 84 | Fill #0

## 2016-06-14 MED FILL — SUBOXONE 8 MG-2 MG SL FILM: 8-2 | 30 days supply | Qty: 90 | Fill #0

## 2016-06-15 ENCOUNTER — Ambulatory Visit (HOSPITAL_COMMUNITY): Payer: Self-pay | Admitting: Psychiatry

## 2016-06-20 ENCOUNTER — Other Ambulatory Visit (HOSPITAL_COMMUNITY): Payer: Self-pay | Admitting: Psychiatry

## 2016-08-24 ENCOUNTER — Ambulatory Visit: Payer: Medicare Other | Admitting: Nurse Practitioner

## 2016-09-04 ENCOUNTER — Encounter: Payer: Self-pay | Admitting: Nurse Practitioner

## 2016-09-04 ENCOUNTER — Ambulatory Visit (INDEPENDENT_AMBULATORY_CARE_PROVIDER_SITE_OTHER): Payer: Medicare Other | Admitting: Nurse Practitioner

## 2016-09-04 VITALS — BP 118/68 | HR 81 | Ht 68.0 in | Wt 214.2 lb

## 2016-09-04 DIAGNOSIS — R413 Other amnesia: Secondary | ICD-10-CM

## 2016-09-04 DIAGNOSIS — S069X0D Unspecified intracranial injury without loss of consciousness, subsequent encounter: Secondary | ICD-10-CM

## 2016-09-04 DIAGNOSIS — Z5181 Encounter for therapeutic drug level monitoring: Secondary | ICD-10-CM | POA: Diagnosis not present

## 2016-09-04 DIAGNOSIS — R569 Unspecified convulsions: Secondary | ICD-10-CM

## 2016-09-04 DIAGNOSIS — G43009 Migraine without aura, not intractable, without status migrainosus: Secondary | ICD-10-CM

## 2016-09-04 MED ORDER — RIZATRIPTAN BENZOATE 5 MG PO TBDP: 5.0000 mg | ORAL_TABLET | ORAL | 6 refills | Status: DC | PRN

## 2016-09-04 NOTE — Patient Instructions (Addendum)
Continue Lamictal and Depakote at current dose for now Continue Maxalt acutely for migraine Will check labs today Lamictal and VPA levels F/U in 6 months
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
## 2016-09-05 ENCOUNTER — Telehealth: Payer: Self-pay | Admitting: *Deleted

## 2016-09-05 NOTE — Telephone Encounter (Signed)
-----   Message from Nilda RiggsNancy Carolyn Martin, NP sent at 09/05/2016  9:51 AM EDT ----- VPA level a little low Need to increase back to 2 tabs day. Please call patient

## 2016-09-06 LAB — VALPROIC ACID LEVEL: Valproic Acid Lvl: 42 ug/mL — ABNORMAL LOW (ref 50–100)

## 2016-09-06 LAB — LAMOTRIGINE LEVEL: Lamotrigine Lvl: 7.7 ug/mL (ref 2.0–20.0)

## 2016-09-06 NOTE — Progress Notes (Signed)
I have reviewed and agreed above plan. 

## 2016-09-10 NOTE — Telephone Encounter (Signed)
Spoke to pt.  Relayed his lab results.  Lamictal level was good, his VPA (depakote level) was a little low and he needs to increase this back to taking 2 tabs daily.   He verbalized understanding.

## 2016-09-19 ENCOUNTER — Other Ambulatory Visit (HOSPITAL_COMMUNITY): Payer: Self-pay | Admitting: Psychiatry

## 2016-10-29 ENCOUNTER — Ambulatory Visit: Payer: Medicare Other | Admitting: Neurology

## 2016-12-05 ENCOUNTER — Other Ambulatory Visit (HOSPITAL_COMMUNITY): Payer: Self-pay | Admitting: Psychiatry

## 2016-12-24 ENCOUNTER — Other Ambulatory Visit (HOSPITAL_COMMUNITY): Payer: Self-pay

## 2016-12-24 ENCOUNTER — Other Ambulatory Visit (HOSPITAL_COMMUNITY): Payer: Self-pay | Admitting: Psychiatry

## 2016-12-24 MED ORDER — DIVALPROEX SODIUM 500 MG PO DR TAB: DELAYED_RELEASE_TABLET | ORAL | 0 refills | Status: DC

## 2017-01-07 ENCOUNTER — Ambulatory Visit (HOSPITAL_COMMUNITY): Payer: Self-pay | Admitting: Psychiatry

## 2017-01-20 ENCOUNTER — Other Ambulatory Visit (HOSPITAL_COMMUNITY): Payer: Self-pay | Admitting: Psychiatry

## 2017-01-21 ENCOUNTER — Other Ambulatory Visit (HOSPITAL_COMMUNITY): Payer: Self-pay

## 2017-01-24 ENCOUNTER — Encounter (HOSPITAL_COMMUNITY): Payer: 59 | Admitting: Psychiatry

## 2017-01-24 ENCOUNTER — Encounter (HOSPITAL_COMMUNITY): Payer: Self-pay | Admitting: Psychiatry

## 2017-01-25 ENCOUNTER — Ambulatory Visit (INDEPENDENT_AMBULATORY_CARE_PROVIDER_SITE_OTHER): Payer: 59 | Admitting: Psychiatry

## 2017-01-25 ENCOUNTER — Encounter (HOSPITAL_COMMUNITY): Payer: Self-pay | Admitting: Psychiatry

## 2017-01-25 VITALS — BP 120/74 | HR 75 | Ht 67.0 in | Wt 216.6 lb

## 2017-01-25 DIAGNOSIS — Z811 Family history of alcohol abuse and dependence: Secondary | ICD-10-CM | POA: Diagnosis not present

## 2017-01-25 DIAGNOSIS — F0630 Mood disorder due to known physiological condition, unspecified: Secondary | ICD-10-CM

## 2017-01-25 DIAGNOSIS — Z809 Family history of malignant neoplasm, unspecified: Secondary | ICD-10-CM

## 2017-01-25 DIAGNOSIS — Z813 Family history of other psychoactive substance abuse and dependence: Secondary | ICD-10-CM | POA: Diagnosis not present

## 2017-01-25 DIAGNOSIS — Z823 Family history of stroke: Secondary | ICD-10-CM

## 2017-01-25 DIAGNOSIS — Z79899 Other long term (current) drug therapy: Secondary | ICD-10-CM

## 2017-01-25 DIAGNOSIS — Z818 Family history of other mental and behavioral disorders: Secondary | ICD-10-CM

## 2017-01-25 MED ORDER — DIVALPROEX SODIUM 500 MG PO DR TAB: DELAYED_RELEASE_TABLET | ORAL | 1 refills | Status: DC

## 2017-01-25 NOTE — Progress Notes (Signed)
This encounter was created in error - please disregard.
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
## 2017-02-12 ENCOUNTER — Telehealth: Payer: Self-pay | Admitting: Neurology

## 2017-02-12 DIAGNOSIS — F0630 Mood disorder due to known physiological condition, unspecified: Secondary | ICD-10-CM

## 2017-02-12 NOTE — Telephone Encounter (Signed)
Pt called says Dr Ovidio KinArchana Kumar/psych told him Dr Terrace ArabiaYan could prescribe divalproex (DEPAKOTE) 500 MG DR tablet , pt is in need of refills now sent [facility].

## 2017-02-13 MED ORDER — DIVALPROEX SODIUM 500 MG PO DR TAB: 500.0000 mg | DELAYED_RELEASE_TABLET | Freq: Two times a day (BID) | ORAL | 2 refills | Status: DC

## 2017-02-13 NOTE — Telephone Encounter (Signed)
Spoke to patient and scheduled his follow up appt on 03/18/17.  Dr. Terrace ArabiaYan has provided refills until this time.  Further refills can be provided at his appt.

## 2017-03-21 ENCOUNTER — Ambulatory Visit (INDEPENDENT_AMBULATORY_CARE_PROVIDER_SITE_OTHER): Payer: Medicare Other | Admitting: Nurse Practitioner

## 2017-03-21 ENCOUNTER — Encounter: Payer: Self-pay | Admitting: Nurse Practitioner

## 2017-03-21 VITALS — BP 121/78 | HR 82 | Wt 229.4 lb

## 2017-03-21 DIAGNOSIS — G43009 Migraine without aura, not intractable, without status migrainosus: Secondary | ICD-10-CM | POA: Diagnosis not present

## 2017-03-21 DIAGNOSIS — Z5181 Encounter for therapeutic drug level monitoring: Secondary | ICD-10-CM | POA: Diagnosis not present

## 2017-03-21 DIAGNOSIS — S069X0S Unspecified intracranial injury without loss of consciousness, sequela: Secondary | ICD-10-CM | POA: Diagnosis not present

## 2017-03-21 DIAGNOSIS — R569 Unspecified convulsions: Secondary | ICD-10-CM

## 2017-03-21 DIAGNOSIS — F0630 Mood disorder due to known physiological condition, unspecified: Secondary | ICD-10-CM

## 2017-03-21 MED ORDER — LAMOTRIGINE 100 MG PO TABS: ORAL_TABLET | ORAL | 11 refills | Status: DC

## 2017-03-21 MED ORDER — DIVALPROEX SODIUM 500 MG PO DR TAB: 500.0000 mg | DELAYED_RELEASE_TABLET | Freq: Two times a day (BID) | ORAL | 11 refills | Status: DC
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
## 2017-03-21 NOTE — Patient Instructions (Addendum)
Continue Lamictal  2 at hs Continue Depakote  2 tabs daily Will check labs today, Lamictal level and Depakote level for  Therapeutic level CBC and CMP  F/U in 1 year next with Dr. Terrace Arabia

## 2017-03-22 NOTE — Progress Notes (Signed)
I have reviewed and agreed above plan. 

## 2017-03-25 ENCOUNTER — Telehealth: Payer: Self-pay | Admitting: *Deleted

## 2017-03-25 LAB — CBC WITH DIFFERENTIAL/PLATELET
BASOS ABS: 0.1 10*3/uL (ref 0.0–0.2)
Basos: 1 %
EOS (ABSOLUTE): 0.4 10*3/uL (ref 0.0–0.4)
Eos: 4 %
HEMATOCRIT: 45.9 % (ref 37.5–51.0)
HEMOGLOBIN: 15.2 g/dL (ref 13.0–17.7)
Immature Grans (Abs): 0.1 10*3/uL (ref 0.0–0.1)
Immature Granulocytes: 1 %
LYMPHS ABS: 3.4 10*3/uL — AB (ref 0.7–3.1)
Lymphs: 32 %
MCH: 31.7 pg (ref 26.6–33.0)
MCHC: 33.1 g/dL (ref 31.5–35.7)
MCV: 96 fL (ref 79–97)
MONOCYTES: 7 %
MONOS ABS: 0.8 10*3/uL (ref 0.1–0.9)
NEUTROS ABS: 6.1 10*3/uL (ref 1.4–7.0)
Neutrophils: 55 %
Platelets: 285 10*3/uL (ref 150–379)
RBC: 4.79 x10E6/uL (ref 4.14–5.80)
RDW: 13.9 % (ref 12.3–15.4)
WBC: 10.8 10*3/uL (ref 3.4–10.8)

## 2017-03-25 LAB — COMPREHENSIVE METABOLIC PANEL
A/G RATIO: 1.6 (ref 1.2–2.2)
ALBUMIN: 4.1 g/dL (ref 3.5–5.5)
ALK PHOS: 61 IU/L (ref 39–117)
ALT: 28 IU/L (ref 0–44)
AST: 23 IU/L (ref 0–40)
BILIRUBIN TOTAL: 0.2 mg/dL (ref 0.0–1.2)
BUN / CREAT RATIO: 17 (ref 9–20)
BUN: 15 mg/dL (ref 6–20)
CHLORIDE: 97 mmol/L (ref 96–106)
CO2: 26 mmol/L (ref 18–29)
Calcium: 9.6 mg/dL (ref 8.7–10.2)
Creatinine, Ser: 0.9 mg/dL (ref 0.76–1.27)
GFR calc Af Amer: 124 mL/min/{1.73_m2} (ref >59)
GFR calc non Af Amer: 107 mL/min/{1.73_m2} (ref >59)
GLOBULIN, TOTAL: 2.5 g/dL (ref 1.5–4.5)
Glucose: 86 mg/dL (ref 65–99)
Potassium: 5 mmol/L (ref 3.5–5.2)
SODIUM: 138 mmol/L (ref 134–144)
TOTAL PROTEIN: 6.6 g/dL (ref 6.0–8.5)

## 2017-03-25 LAB — LAMOTRIGINE LEVEL: Lamotrigine Lvl: 8.3 ug/mL (ref 2.0–20.0)

## 2017-03-25 LAB — VALPROIC ACID LEVEL: VALPROIC ACID LVL: 52 ug/mL (ref 50–100)

## 2017-03-25 NOTE — Telephone Encounter (Signed)
LVM requesting call back re: lab results.  

## 2017-03-26 NOTE — Telephone Encounter (Signed)
Per Enid Skeens, NP, spoke with patient and informed him that his labs look good. Advised he please continue taking the same doses of Lamictal and Depakote.  Patient verbalized understanding, appreciation.

## 2017-05-03 ENCOUNTER — Other Ambulatory Visit: Payer: Self-pay | Admitting: Neurology

## 2017-06-20 ENCOUNTER — Other Ambulatory Visit: Payer: Self-pay | Admitting: Neurology

## 2017-06-20 DIAGNOSIS — F0630 Mood disorder due to known physiological condition, unspecified: Secondary | ICD-10-CM

## 2017-06-25 ENCOUNTER — Other Ambulatory Visit: Payer: Self-pay | Admitting: Neurology

## 2017-06-25 DIAGNOSIS — F0630 Mood disorder due to known physiological condition, unspecified: Secondary | ICD-10-CM

## 2018-01-06 ENCOUNTER — Other Ambulatory Visit: Payer: Self-pay | Admitting: Nurse Practitioner

## 2018-01-06 DIAGNOSIS — F0630 Mood disorder due to known physiological condition, unspecified: Secondary | ICD-10-CM

## 2018-03-25 ENCOUNTER — Ambulatory Visit: Payer: Medicare Other | Admitting: Neurology

## 2018-03-25 ENCOUNTER — Telehealth: Payer: Self-pay | Admitting: *Deleted

## 2018-03-25 NOTE — Telephone Encounter (Signed)
Pt arrived late to his appt and was unable to be seen.  His follow up was rescheduled.

## 2018-03-31 ENCOUNTER — Other Ambulatory Visit: Payer: Self-pay | Admitting: Nurse Practitioner

## 2018-03-31 DIAGNOSIS — F0630 Mood disorder due to known physiological condition, unspecified: Secondary | ICD-10-CM

## 2018-05-23 ENCOUNTER — Other Ambulatory Visit: Payer: Self-pay | Admitting: Nurse Practitioner

## 2018-05-23 DIAGNOSIS — F0630 Mood disorder due to known physiological condition, unspecified: Secondary | ICD-10-CM

## 2018-06-09 ENCOUNTER — Encounter: Payer: Self-pay | Admitting: Neurology

## 2018-06-21 ENCOUNTER — Other Ambulatory Visit: Payer: Self-pay | Admitting: Nurse Practitioner

## 2018-06-23 NOTE — Telephone Encounter (Signed)
Will refill Lamictal x 3 months. Patient needs to call office, speak with billing to make payment. He then needs to schedule a follow up. He was last seen 03/2017.

## 2018-06-30 ENCOUNTER — Ambulatory Visit: Payer: Medicare Other | Admitting: Neurology

## 2018-08-27 ENCOUNTER — Other Ambulatory Visit: Payer: Self-pay | Admitting: Nurse Practitioner

## 2018-08-27 DIAGNOSIS — F0630 Mood disorder due to known physiological condition, unspecified: Secondary | ICD-10-CM

## 2018-09-17 ENCOUNTER — Other Ambulatory Visit: Payer: Self-pay | Admitting: Neurology

## 2018-09-30 ENCOUNTER — Other Ambulatory Visit: Payer: Self-pay | Admitting: Neurology

## 2018-10-17 ENCOUNTER — Other Ambulatory Visit: Payer: Self-pay | Admitting: Nurse Practitioner

## 2018-10-17 DIAGNOSIS — F0630 Mood disorder due to known physiological condition, unspecified: Secondary | ICD-10-CM

## 2018-10-18 ENCOUNTER — Other Ambulatory Visit: Payer: Self-pay | Admitting: Nurse Practitioner

## 2018-10-18 DIAGNOSIS — F0630 Mood disorder due to known physiological condition, unspecified: Secondary | ICD-10-CM

## 2018-10-20 NOTE — Telephone Encounter (Signed)
Refilled x 3 months with note to pharmacy: have patient call and schedule follow up.

## 2018-10-24 ENCOUNTER — Other Ambulatory Visit: Payer: Self-pay | Admitting: Neurology

## 2018-11-08 ENCOUNTER — Other Ambulatory Visit: Payer: Self-pay | Admitting: Neurology

## 2018-11-12 ENCOUNTER — Telehealth: Payer: Self-pay | Admitting: Nurse Practitioner

## 2018-11-12 MED ORDER — LAMOTRIGINE 100 MG PO TABS: 200.0000 mg | ORAL_TABLET | Freq: Every day | ORAL | 1 refills | Status: DC

## 2018-11-12 NOTE — Telephone Encounter (Signed)
Pt scheduled f/u for 1/3(next opening) requesting refills for lamoTRIgine (LAMICTAL) 100 MG tablet sent to [facility]

## 2018-11-12 NOTE — Telephone Encounter (Signed)
E-scribed refills to pharmacy as requested.  

## 2018-11-16 DIAGNOSIS — T148XXA Other injury of unspecified body region, initial encounter: Secondary | ICD-10-CM

## 2018-11-16 HISTORY — DX: Other injury of unspecified body region, initial encounter: T14.8XXA

## 2018-12-05 ENCOUNTER — Other Ambulatory Visit: Payer: Self-pay | Admitting: Nurse Practitioner
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
## 2018-12-19 ENCOUNTER — Ambulatory Visit: Payer: Medicare Other | Admitting: Nurse Practitioner

## 2018-12-19 ENCOUNTER — Encounter: Payer: Self-pay | Admitting: Nurse Practitioner

## 2018-12-19 VITALS — BP 106/69 | HR 80 | Ht 68.0 in | Wt 222.0 lb

## 2018-12-19 DIAGNOSIS — R569 Unspecified convulsions: Secondary | ICD-10-CM

## 2018-12-19 DIAGNOSIS — R400 Somnolence: Secondary | ICD-10-CM | POA: Diagnosis not present

## 2018-12-19 DIAGNOSIS — G43009 Migraine without aura, not intractable, without status migrainosus: Secondary | ICD-10-CM | POA: Diagnosis not present

## 2018-12-19 NOTE — Progress Notes (Signed)
I have read the note, and I agree with the clinical assessment and plan.  Lino K Halie Gass   

## 2018-12-19 NOTE — Patient Instructions (Addendum)
Continue Lamictal 100mg  2 at hs Continue Depakote 500mg  2 tabs daily Will check labs today, Lamictal level and Depakote level for  Therapeutic level CBC and CMP were reviewed from [facility] done on 10/16/18  We will set up for sleep evaluation, ESS 15 FSS  40 I explained in particular the risks and ramifications of untreated moderate to severe OSA, especially with respect to cardiovascular disease  including congestive heart failure, difficult to treat hypertension, cardiac arrhythmias, or stroke. Even type 2 diabetes has, in part, been linked to untreated OSA. Symptoms of untreated OSA include daytime sleepiness, memory problems, mood irritability and mood disorder such as depression and anxiety, lack of energy, as well as recurrent headaches, especially morning headaches. We talked about trying to maintain a healthy lifestyle in general, as well as the importance of weight control. I encouraged the patient to eat healthy, exercise daily and keep well hydrated, to keep a scheduled bedtime and wake time routine, to not skip any meals and eat healthy snacks in between meals

## 2018-12-22 LAB — LAMOTRIGINE LEVEL: LAMOTRIGINE LVL: 6 ug/mL (ref 2.0–20.0)

## 2018-12-22 LAB — VALPROIC ACID LEVEL: Valproic Acid Lvl: 44 ug/mL — ABNORMAL LOW (ref 50–100)

## 2018-12-23 ENCOUNTER — Telehealth: Payer: Self-pay | Admitting: *Deleted

## 2018-12-23 ENCOUNTER — Other Ambulatory Visit: Payer: Self-pay | Admitting: Nurse Practitioner

## 2018-12-23 DIAGNOSIS — F0630 Mood disorder due to known physiological condition, unspecified: Secondary | ICD-10-CM

## 2018-12-23 MED ORDER — LAMOTRIGINE 100 MG PO TABS: 200.0000 mg | ORAL_TABLET | Freq: Every day | ORAL | 3 refills | Status: DC

## 2018-12-23 MED ORDER — DIVALPROEX SODIUM 500 MG PO DR TAB: 500.0000 mg | DELAYED_RELEASE_TABLET | Freq: Two times a day (BID) | ORAL | 3 refills | Status: DC

## 2018-12-23 NOTE — Telephone Encounter (Signed)
LVM informing the patient that his labs showed Valproic acid and Lamictal levels are okay. His last reported seizure was in 2013. Advised him the NP has refilled his medications. Left number for any questions.

## 2019-12-09 ENCOUNTER — Encounter: Payer: Self-pay | Admitting: Gastroenterology

## 2020-01-15 ENCOUNTER — Ambulatory Visit: Payer: Self-pay | Admitting: Gastroenterology

## 2020-02-09 ENCOUNTER — Telehealth: Payer: Self-pay

## 2020-02-09 DIAGNOSIS — F0630 Mood disorder due to known physiological condition, unspecified: Secondary | ICD-10-CM

## 2020-02-09 MED ORDER — DIVALPROEX SODIUM 500 MG PO DR TAB: 500.0000 mg | DELAYED_RELEASE_TABLET | Freq: Two times a day (BID) | ORAL | 0 refills | Status: DC

## 2020-02-09 NOTE — Telephone Encounter (Signed)
New script has been sent to the patient's pharmacy  

## 2020-03-02 ENCOUNTER — Other Ambulatory Visit: Payer: Self-pay | Admitting: Neurology

## 2020-03-02 DIAGNOSIS — F0630 Mood disorder due to known physiological condition, unspecified: Secondary | ICD-10-CM

## 2020-03-15 ENCOUNTER — Other Ambulatory Visit: Payer: Self-pay | Admitting: *Deleted

## 2020-03-15 MED ORDER — LAMOTRIGINE 100 MG PO TABS: 200.0000 mg | ORAL_TABLET | Freq: Every day | ORAL | 1 refills | Status: DC
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
## 2020-05-02 ENCOUNTER — Encounter: Payer: Self-pay | Admitting: Neurology

## 2020-05-02 ENCOUNTER — Other Ambulatory Visit: Payer: Self-pay

## 2020-05-02 ENCOUNTER — Ambulatory Visit: Payer: Medicare Other | Admitting: Neurology

## 2020-05-02 VITALS — BP 117/72 | HR 85 | Temp 98.2°F | Ht 68.0 in | Wt 232.0 lb

## 2020-05-02 DIAGNOSIS — R569 Unspecified convulsions: Secondary | ICD-10-CM

## 2020-05-02 DIAGNOSIS — S069X0S Unspecified intracranial injury without loss of consciousness, sequela: Secondary | ICD-10-CM

## 2020-05-02 DIAGNOSIS — F0630 Mood disorder due to known physiological condition, unspecified: Secondary | ICD-10-CM | POA: Diagnosis not present

## 2020-05-02 MED ORDER — DIVALPROEX SODIUM 500 MG PO DR TAB: 500.0000 mg | DELAYED_RELEASE_TABLET | Freq: Two times a day (BID) | ORAL | 0 refills | Status: DC

## 2020-05-02 NOTE — Progress Notes (Signed)
I have reviewed and agreed above plan. 

## 2020-05-02 NOTE — Patient Instructions (Signed)
Check blood work today  Let's consider sleep study  See you back in 6 months

## 2020-05-03 LAB — CBC WITH DIFFERENTIAL/PLATELET
Basophils Absolute: 0.1 10*3/uL (ref 0.0–0.2)
Basos: 1 %
EOS (ABSOLUTE): 0.7 10*3/uL — ABNORMAL HIGH (ref 0.0–0.4)
Eos: 8 %
Hematocrit: 39.8 % (ref 37.5–51.0)
Hemoglobin: 13.2 g/dL (ref 13.0–17.7)
Immature Grans (Abs): 0 10*3/uL (ref 0.0–0.1)
Immature Granulocytes: 0 %
Lymphocytes Absolute: 2.8 10*3/uL (ref 0.7–3.1)
Lymphs: 30 %
MCH: 31.2 pg (ref 26.6–33.0)
MCHC: 33.2 g/dL (ref 31.5–35.7)
MCV: 94 fL (ref 79–97)
Monocytes Absolute: 0.6 10*3/uL (ref 0.1–0.9)
Monocytes: 6 %
Neutrophils Absolute: 5.1 10*3/uL (ref 1.4–7.0)
Neutrophils: 55 %
Platelets: 284 10*3/uL (ref 150–450)
RBC: 4.23 x10E6/uL (ref 4.14–5.80)
RDW: 12.1 % (ref 11.6–15.4)
WBC: 9.3 10*3/uL (ref 3.4–10.8)

## 2020-05-03 LAB — COMPREHENSIVE METABOLIC PANEL
ALT: 32 IU/L (ref 0–44)
AST: 25 IU/L (ref 0–40)
Albumin/Globulin Ratio: 1.7 (ref 1.2–2.2)
Albumin: 4.2 g/dL (ref 4.0–5.0)
Alkaline Phosphatase: 92 IU/L (ref 48–121)
BUN/Creatinine Ratio: 29 — ABNORMAL HIGH (ref 9–20)
BUN: 23 mg/dL (ref 6–24)
Bilirubin Total: <0.2 mg/dL (ref 0.0–1.2)
CO2: 27 mmol/L (ref 20–29)
Calcium: 9.4 mg/dL (ref 8.7–10.2)
Chloride: 104 mmol/L (ref 96–106)
Creatinine, Ser: 0.78 mg/dL (ref 0.76–1.27)
GFR calc Af Amer: 129 mL/min/{1.73_m2} (ref >59)
GFR calc non Af Amer: 111 mL/min/{1.73_m2} (ref >59)
Globulin, Total: 2.5 g/dL (ref 1.5–4.5)
Glucose: 94 mg/dL (ref 65–99)
Potassium: 4.1 mmol/L (ref 3.5–5.2)
Sodium: 141 mmol/L (ref 134–144)
Total Protein: 6.7 g/dL (ref 6.0–8.5)

## 2020-05-03 LAB — LAMOTRIGINE LEVEL: Lamotrigine Lvl: 4.6 ug/mL (ref 2.0–20.0)

## 2020-05-03 LAB — VALPROIC ACID LEVEL: Valproic Acid Lvl: 27 ug/mL — ABNORMAL LOW (ref 50–100)

## 2020-05-04 ENCOUNTER — Telehealth: Payer: Self-pay | Admitting: Neurology

## 2020-05-04 MED ORDER — LAMOTRIGINE ER 200 MG PO TB24: 200.0000 mg | ORAL_TABLET | Freq: Every day | ORAL | 11 refills | Status: DC

## 2020-05-04 MED ORDER — DIVALPROEX SODIUM ER 500 MG PO TB24: 500.0000 mg | ORAL_TABLET | Freq: Every day | ORAL | 11 refills | Status: DC

## 2020-05-04 NOTE — Telephone Encounter (Signed)
I called the patient.  He is supposed to be taking Depakote 500 mg DR tablet twice daily, Lamictal 20 mg at bedtime.  He is only taking Depakote 500 mg, Lamictal 100 mg at bedtime, says he stopped this about 2 months ago, due to feeling drowsy.  Labs overall unremarkable, Lamictal level 4.6, Depakote level 27.  Talked to Dr. Terrace Arabia about adjusting his medications, will switch to Depakote ER 500 mg at bedtime, Lamictal XR 200 mg at bedtime.  Hopefully, less side effect, better compliance.  Was initially placed on these medications for treatment of bipolar disorder, over years remained on both for seizure control as well. Call for any problems or dose adjustment

## 2020-05-28 ENCOUNTER — Other Ambulatory Visit: Payer: Self-pay | Admitting: Neurology

## 2020-05-28 DIAGNOSIS — F0630 Mood disorder due to known physiological condition, unspecified: Secondary | ICD-10-CM

## 2020-11-02 ENCOUNTER — Ambulatory Visit: Payer: Medicare Other | Admitting: Neurology

## 2020-11-09 ENCOUNTER — Ambulatory Visit: Payer: Medicare Other | Admitting: Neurology

## 2020-12-14 ENCOUNTER — Telehealth: Payer: Self-pay | Admitting: *Deleted

## 2020-12-14 ENCOUNTER — Other Ambulatory Visit: Payer: Self-pay | Admitting: Neurology

## 2020-12-14 ENCOUNTER — Telehealth: Payer: Self-pay | Admitting: Neurology

## 2020-12-14 ENCOUNTER — Ambulatory Visit (INDEPENDENT_AMBULATORY_CARE_PROVIDER_SITE_OTHER): Payer: Medicare Other | Admitting: Neurology

## 2020-12-14 ENCOUNTER — Encounter: Payer: Self-pay | Admitting: Neurology

## 2020-12-14 VITALS — BP 101/69 | Ht 67.0 in | Wt 235.0 lb

## 2020-12-14 DIAGNOSIS — R569 Unspecified convulsions: Secondary | ICD-10-CM | POA: Diagnosis not present

## 2020-12-14 DIAGNOSIS — S069X0S Unspecified intracranial injury without loss of consciousness, sequela: Secondary | ICD-10-CM | POA: Diagnosis not present

## 2020-12-14 MED ORDER — LAMOTRIGINE ER 200 MG PO TB24: 200.0000 mg | ORAL_TABLET | Freq: Every day | ORAL | 11 refills | Status: DC

## 2020-12-14 MED ORDER — DIVALPROEX SODIUM ER 500 MG PO TB24: 500.0000 mg | ORAL_TABLET | Freq: Every day | ORAL | 11 refills | Status: DC
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
## 2020-12-14 NOTE — Telephone Encounter (Signed)
Pharmacy asking to change from LamoTRIgine (LAMICTAL XR) 200 MG TB24 24 hour tablet to lamoTRIgine (LAMICTAL) 200 MG tablet due to cost of med being $1100.  Started PA on CMM for Lamictal XR 200 mg PA, key: [id number].   Per CMM You are requesting a brand medication where a generic equivalent is available. If the generic medication is preferred then the case will be cancelled and a new request for the generic will be initiated. Will the request be for the following generic equivalent: LAMOTRIGINE TAB ER 24HR 200 MG. Will ask NP if preferred drug can be ordered.

## 2020-12-14 NOTE — Patient Instructions (Signed)
Check blood levels Check EEG For now, continue current medications Consider sleep study  See you back I 6 months

## 2020-12-14 NOTE — Telephone Encounter (Addendum)
Per NP, PA switched to generic equivalent: LAMOTRIGINE TAB ER 24HR 200 MG. New PA started on CMM, key: [id number]. Your information has been sent to [facility]. Lamotrigine tab 200 mg ER approved as non-formulary, effective 12/17/20 through 12/16/21, under Medicare part D. Called pharmacy, spoke with Weston Brass and advised of approval. He  verbalized understanding, appreciation.

## 2020-12-14 NOTE — Telephone Encounter (Signed)
LVM advising patient Shannon Pace approved he get generic equivalent of Lamictal in order to get insurance approval. Advised he Please  be mindful if he feels any differently, call for any questions, concerns, problems. Left #. Marland Kitchen

## 2020-12-14 NOTE — Telephone Encounter (Signed)
Called [facility] [place], spoke with Weston Brass and advised him of approval. He verbalized understanding, appreciation, stated no new Rx needed.

## 2020-12-15 LAB — VALPROIC ACID LEVEL: Valproic Acid Lvl: 25 ug/mL — ABNORMAL LOW (ref 50–100)

## 2020-12-15 LAB — LAMOTRIGINE LEVEL: Lamotrigine Lvl: 2.8 ug/mL (ref 2.0–20.0)

## 2020-12-19 ENCOUNTER — Telehealth: Payer: Self-pay

## 2020-12-19 NOTE — Telephone Encounter (Signed)
-----   Message from Glean Salvo, NP sent at 12/19/2020  9:36 AM EST ----- Depakote level is low, lamictal level is within normal range. Recommend continue with current medications for now. Will wait for EEG before considering any changes.

## 2020-12-19 NOTE — Telephone Encounter (Signed)
I called pt. Left a VM per DPR advising him of labs results.

## 2020-12-21 ENCOUNTER — Other Ambulatory Visit: Payer: Medicare Other

## 2020-12-28 ENCOUNTER — Ambulatory Visit: Payer: Medicare Other | Admitting: Diagnostic Neuroimaging

## 2020-12-28 DIAGNOSIS — R569 Unspecified convulsions: Secondary | ICD-10-CM

## 2021-01-17 NOTE — Procedures (Signed)
   HISTORY: 45 year old male with history of traumatic memory trouble, seizure TECHNIQUE:  This is a routine 16 channel EEG recording with one channel devoted to a limited EKG recording.  It was performed during wakefulness, drowsiness and asleep.  Hyperventilation and photic stimulation were performed as activating procedures.  There are minimum muscle and movement artifact noted.  Upon maximum arousal, posterior dominant waking rhythm consistent of rhythmic alpha range activity, with frequency of 10 hz. Activities are symmetric over the bilateral posterior derivations and attenuated with eye opening.  Hyperventilation produced mild/moderate buildup with higher amplitude and the slower activities noted.  Photic stimulation did not alter the tracing.  During EEG recording, patient developed drowsiness and no deeper stage of sleep was achieved  During EEG recording, there was no epileptiform discharge noted.  EKG demonstrate sinus rhythm, with heart rate of 72 bpm  CONCLUSION: This is a  normal awake EEG.  There is no electrodiagnostic evidence of epileptiform discharge.  Levert Feinstein, M.D. Ph.D.  [facility] [address] [place], [place] [address] Phone: [phone] Fax:      [phone]

## 2021-01-18 ENCOUNTER — Telehealth: Payer: Self-pay | Admitting: Neurology

## 2021-01-18 NOTE — Telephone Encounter (Signed)
LMVM for pt to return call for results.  ?

## 2021-01-18 NOTE — Telephone Encounter (Signed)
Spoke to pt relayed EEG results normal.  Continue current regimen for now.  He states wants to come off one of medications.  I instructed potential for sz and will discuss in June with Dr. Terrace Arabia.  He asked about getting to be seen sooner.  I would forward to Dr. Zannie Cove nurse for placing on list if cancellation becomes available.  He verbalized understanding and appreciation.

## 2021-01-18 NOTE — Telephone Encounter (Signed)
Please call the patient.  EEG was normal.  Discussed with Dr. Terrace Arabia, would recommend for now, continue current medications.  Keep appointment in June with Dr. Terrace Arabia to discuss medications going forward.  Is a big decision to make about adjusting medications, risking potential seizure and not being able to drive.  He is at risk for seizure due to encephalomalacia in the anterior frontal and temporal lobes. He is expecting a baby this month, hate to make changes during stressful time.   CONCLUSION: This is a  normal awake EEG.  There is no electrodiagnostic evidence of epileptiform discharge.

## 2021-01-19 NOTE — Progress Notes (Signed)
I have reviewed and agreed above plan. 

## 2021-01-19 NOTE — Telephone Encounter (Addendum)
I am happy to help get the patient in for a visit before June. I left a message asking him to call back, if he would like to look at alternate times.  If the patient calls back, it is okay to use one of the RN work-in slots on Dr. Zannie Cove schedule.

## 2021-02-07 NOTE — Telephone Encounter (Signed)
error 

## 2021-03-24 ENCOUNTER — Other Ambulatory Visit: Payer: Self-pay | Admitting: Neurology

## 2021-03-28 ENCOUNTER — Telehealth: Payer: Self-pay | Admitting: Neurology

## 2021-03-28 NOTE — Telephone Encounter (Signed)
Called patient and informed him refill was sent Dec 2021 x 1 year. [facility] stated [facility]'t [facility], said needs a new Rx. I advised will call [facility] and let him know. Patient verbalized understanding, appreciation. [facility] spoke with Joni Reining who stated refill went through for $289 for 90 days supply. She stated patient hasn't refilled since last July and was using old Rx to get refills. Called patient and LVM advising him of conversation with Joni Reining. Left # for questions.

## 2021-03-28 NOTE — Telephone Encounter (Signed)
Pt request refill LamoTRIgine (LAMICTAL XR) 200 MG TB24 [facility] at [facility] [phone]

## 2021-04-03 ENCOUNTER — Other Ambulatory Visit: Payer: Self-pay | Admitting: Neurology

## 2021-04-04 ENCOUNTER — Telehealth: Payer: Self-pay | Admitting: *Deleted

## 2021-04-04 MED ORDER — LAMOTRIGINE 100 MG PO TABS: 100.0000 mg | ORAL_TABLET | Freq: Two times a day (BID) | ORAL | 11 refills | Status: DC

## 2021-04-04 NOTE — Telephone Encounter (Signed)
Called LMVM for pt that did get request for lamotrigine.  Sent in prescription for 100mg  po bid to [facility].  He is to call back if questions.

## 2021-04-04 NOTE — Telephone Encounter (Signed)
Currently taking Lamictal XR 200 mg daily, cost is too high, will switch to IR, Lamictal 100 mg twice daily.

## 2021-04-04 NOTE — Telephone Encounter (Signed)
Received refill request for Lamotrigine 200mg  no ER as cost id $289 for ER and $20 for IR.  Please advise.

## 2021-06-06 ENCOUNTER — Ambulatory Visit: Payer: Medicare Other | Admitting: Neurology

## 2021-06-14 ENCOUNTER — Ambulatory Visit: Payer: Medicare Other | Admitting: Neurology

## 2021-06-14 ENCOUNTER — Encounter: Payer: Self-pay | Admitting: Neurology

## 2021-06-14 VITALS — BP 116/79 | HR 69 | Ht 67.0 in | Wt 236.0 lb

## 2021-06-14 DIAGNOSIS — S069X0S Unspecified intracranial injury without loss of consciousness, sequela: Secondary | ICD-10-CM

## 2021-06-14 DIAGNOSIS — R569 Unspecified convulsions: Secondary | ICD-10-CM

## 2021-06-14 DIAGNOSIS — G4730 Sleep apnea, unspecified: Secondary | ICD-10-CM

## 2021-06-14 MED ORDER — LAMOTRIGINE 100 MG PO TABS: 100.0000 mg | ORAL_TABLET | Freq: Two times a day (BID) | ORAL | 4 refills | Status: DC
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
## 2021-06-15 ENCOUNTER — Ambulatory Visit: Payer: Medicare Other | Admitting: Neurology

## 2021-08-30 ENCOUNTER — Institutional Professional Consult (permissible substitution): Payer: Medicare Other | Admitting: Neurology

## 2021-09-27 ENCOUNTER — Telehealth: Payer: Self-pay | Admitting: *Deleted

## 2021-09-27 NOTE — Telephone Encounter (Signed)
Surgery clearance form received from Shannon Pace (Dr. Weber Cooks) for left total knee replacement. Dr. Terrace Arabia completed and cleared him to have the surgery with the instructions to keep lamotrigine 100mg  BID. Faxed and confirmed back to [phone], attention: [phone].

## 2021-11-15 ENCOUNTER — Ambulatory Visit: Payer: Self-pay | Admitting: Orthopedic Surgery

## 2021-11-15 DIAGNOSIS — F17210 Nicotine dependence, cigarettes, uncomplicated: Secondary | ICD-10-CM

## 2021-11-15 NOTE — Progress Notes (Signed)
Sent message, via epic in basket, requesting orders in epic from surgeon.  
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
## 2021-11-20 NOTE — Patient Instructions (Addendum)
DUE TO COVID-19 ONLY ONE VISITOR IS ALLOWED TO COME WITH YOU AND STAY IN THE WAITING ROOM ONLY DURING PRE OP AND PROCEDURE.   **NO VISITORS ARE ALLOWED IN THE SHORT STAY AREA OR RECOVERY ROOM!!**  IF YOU WILL BE ADMITTED INTO THE HOSPITAL YOU ARE ALLOWED ONLY TWO SUPPORT PEOPLE DURING VISITATION HOURS ONLY (7 AM -8PM)   The support person(s) must pass our screening, gel in and out, and wear a mask at all times, including in the patient's room. Patients must also wear a mask when staff or their support person are in the room. Visitors GUEST BADGE MUST BE WORN VISIBLY  One adult visitor may remain with you overnight and MUST be in the room by 8 P.M.  No visitors under the age of 36. Any visitor under the age of 68 must be accompanied by an adult.    Report to [facility] Main Entrance    Report to admitting at: 7:30 AM   Call this number if you have problems the morning of surgery [phone]   Do not eat food :After Midnight.   May have liquids until: 7:15 AM    day of surgery  CLEAR LIQUID DIET  Foods Allowed                                                                     Foods Excluded  Water, Black Coffee and tea, regular and decaf                             liquids that you cannot  Plain Jell-O in any flavor  (No red)                                           see through such as: Fruit ices (not with fruit pulp)                                     milk, soups, orange juice              Iced Popsicles (No red)                                    All solid food                                   Apple juices Sports drinks like Gatorade (No red) Lightly seasoned clear broth or consume(fat free) Sugar  Sample Menu Breakfast                                Lunch                                     Supper  Cranberry juice                    Beef broth                            Chicken broth Jell-O                                     Grape juice                            Apple juice Coffee or tea                        Jell-O                                      Popsicle                                                Coffee or tea                        Coffee or tea     Oral Hygiene is also important to reduce your risk of infection.                                    Remember - BRUSH YOUR TEETH THE MORNING OF SURGERY WITH YOUR REGULAR TOOTHPASTE   Do NOT smoke after Midnight   Take these medicines the morning of surgery with A SIP OF WATER: lamotrigine,fluoxetine.Use eye drops as usual.  DO NOT TAKE ANY ORAL DIABETIC MEDICATIONS DAY OF YOUR SURGERY                              You may not have any metal on your body including hair pins, jewelry, and body piercing             Do not wear  lotions, powders, perfumes/cologne, or deodorant              Men may shave face and neck.   Do not bring valuables to the hospital. [facility] IS NOT             RESPONSIBLE   FOR VALUABLES.   Contacts, dentures or bridgework may not be worn into surgery.   Bring small overnight bag day of surgery.    Patients discharged on the day of surgery will not be allowed to drive home.   Special Instructions: Bring a copy of your healthcare power of attorney and living will documents         the day of surgery if you haven't scanned them before.              Please read over the following fact sheets you were given: IF YOU HAVE QUESTIONS ABOUT YOUR PRE-OP INSTRUCTIONS PLEASE CALL [phone]     [facility] - Preparing for Surgery Before surgery, you can play an  important role.  Because skin is not sterile, your skin needs to be as free of germs as possible.  You can reduce the number of germs on your skin by washing with CHG (chlorahexidine gluconate) soap before surgery.  CHG is an antiseptic cleaner which kills germs and bonds with the skin to continue killing germs even after washing. Please DO NOT use if you have an allergy to CHG or antibacterial soaps.  If your  skin becomes reddened/irritated stop using the CHG and inform your nurse when you arrive at Short Stay. Do not shave (including legs and underarms) for at least 48 hours prior to the first CHG shower.  You may shave your face/neck. Please follow these instructions carefully:  1.  Shower with CHG Soap the night before surgery and the  morning of Surgery.  2.  If you choose to wash your hair, wash your hair first as usual with your  normal  shampoo.  3.  After you shampoo, rinse your hair and body thoroughly to remove the  shampoo.                           4.  Use CHG as you would any other liquid soap.  You can apply chg directly  to the skin and wash                       Gently with a scrungie or clean washcloth.  5.  Apply the CHG Soap to your body ONLY FROM THE NECK DOWN.   Do not use on face/ open                           Wound or open sores. Avoid contact with eyes, ears mouth and genitals (private parts).                       Wash face,  Genitals (private parts) with your normal soap.             6.  Wash thoroughly, paying special attention to the area where your surgery  will be performed.  7.  Thoroughly rinse your body with warm water from the neck down.  8.  DO NOT shower/wash with your normal soap after using and rinsing off  the CHG Soap.                9.  Pat yourself dry with a clean towel.            10.  Wear clean pajamas.            11.  Place clean sheets on your bed the night of your first shower and do not  sleep with pets. Day of Surgery : Do not apply any lotions/deodorants the morning of surgery.  Please wear clean clothes to the hospital/surgery center.  FAILURE TO FOLLOW THESE INSTRUCTIONS MAY RESULT IN THE CANCELLATION OF YOUR SURGERY PATIENT SIGNATURE_________________________________  NURSE SIGNATURE__________________________________  ________________________________________________________________________

## 2021-11-22 ENCOUNTER — Encounter (HOSPITAL_COMMUNITY): Payer: Self-pay

## 2021-11-22 ENCOUNTER — Other Ambulatory Visit: Payer: Self-pay

## 2021-11-22 ENCOUNTER — Encounter (HOSPITAL_COMMUNITY)
Admission: RE | Admit: 2021-11-22 | Discharge: 2021-11-22 | Disposition: A | Discharge status: Home / Self Care | Payer: Medicare Other | Source: Ambulatory Visit | Attending: Orthopedic Surgery | Admitting: Orthopedic Surgery

## 2021-11-22 VITALS — BP 113/78 | HR 63 | Temp 98.7°F | Ht 67.0 in | Wt 228.0 lb

## 2021-11-22 DIAGNOSIS — I2510 Atherosclerotic heart disease of native coronary artery without angina pectoris: Secondary | ICD-10-CM

## 2021-11-22 DIAGNOSIS — Z01818 Encounter for other preprocedural examination: Secondary | ICD-10-CM | POA: Insufficient documentation

## 2021-11-22 HISTORY — DX: Unspecified osteoarthritis, unspecified site: M19.90

## 2021-11-22 LAB — CBC
HCT: 40 % (ref 39.0–52.0)
Hemoglobin: 13.5 g/dL (ref 13.0–17.0)
MCH: 31.6 pg (ref 26.0–34.0)
MCHC: 33.8 g/dL (ref 30.0–36.0)
MCV: 93.7 fL (ref 80.0–100.0)
Platelets: 299 10*3/uL (ref 150–400)
RBC: 4.27 MIL/uL (ref 4.22–5.81)
RDW: 12.5 % (ref 11.5–15.5)
WBC: 10.3 10*3/uL (ref 4.0–10.5)
nRBC: 0 % (ref 0.0–0.2)

## 2021-11-22 LAB — SURGICAL PCR SCREEN
MRSA, PCR: NEGATIVE
Staphylococcus aureus: NEGATIVE

## 2021-11-22 LAB — BASIC METABOLIC PANEL
Anion gap: 5 (ref 5–15)
BUN: 20 mg/dL (ref 6–20)
CO2: 29 mmol/L (ref 22–32)
Calcium: 8.9 mg/dL (ref 8.9–10.3)
Chloride: 104 mmol/L (ref 98–111)
Creatinine, Ser: 0.58 mg/dL — ABNORMAL LOW (ref 0.61–1.24)
GFR, Estimated: >60 mL/min (ref >60)
Glucose, Bld: 102 mg/dL — ABNORMAL HIGH (ref 70–99)
Potassium: 3.8 mmol/L (ref 3.5–5.1)
Sodium: 138 mmol/L (ref 135–145)

## 2021-11-22 NOTE — Progress Notes (Signed)
COVID Vaccine Completed:NO Date COVID Vaccine completed:  COVID vaccine manufacturer: Pfizer    [employer]  COVID Test: N/A PCP - Dr. Vivi Ferns Cardiologist -   Chest x-ray -  EKG -  Stress Test -  ECHO -  Cardiac Cath -  Pacemaker/ICD device last checked:  Sleep Study -  CPAP -   Fasting Blood Sugar -  Checks Blood Sugar _____ times a day  Blood Thinner Instructions: Aspirin Instructions: Last Dose:  Anesthesia review: Hx: seizures (2013),chest pain,heart murmur  Patient denies shortness of breath, fever, cough and chest pain at PAT appointment   Patient verbalized understanding of instructions that were given to them at the PAT appointment. Patient was also instructed that they will need to review over the PAT instructions again at home before surgery.

## 2021-11-28 ENCOUNTER — Other Ambulatory Visit: Payer: Self-pay | Admitting: Orthopedic Surgery

## 2021-11-29 LAB — SARS CORONAVIRUS 2 (TAT 6-24 HRS): SARS Coronavirus 2: NEGATIVE

## 2021-12-01 ENCOUNTER — Ambulatory Visit (HOSPITAL_COMMUNITY): Payer: Medicare Other | Admitting: Physician Assistant

## 2021-12-01 ENCOUNTER — Ambulatory Visit (HOSPITAL_COMMUNITY)
Admission: RE | Admit: 2021-12-01 | Discharge: 2021-12-01 | Disposition: A | Discharge status: Home / Self Care | Payer: Medicare Other | Source: Ambulatory Visit | Attending: Orthopedic Surgery | Admitting: Orthopedic Surgery

## 2021-12-01 ENCOUNTER — Ambulatory Visit (HOSPITAL_COMMUNITY): Payer: Medicare Other | Admitting: Certified Registered"

## 2021-12-01 ENCOUNTER — Encounter (HOSPITAL_COMMUNITY)
Admission: RE | Disposition: A | Discharge status: Home / Self Care | Payer: Self-pay | Source: Ambulatory Visit | Attending: Orthopedic Surgery

## 2021-12-01 ENCOUNTER — Encounter (HOSPITAL_COMMUNITY): Payer: Self-pay | Admitting: Orthopedic Surgery

## 2021-12-01 ENCOUNTER — Ambulatory Visit (HOSPITAL_COMMUNITY): Payer: Medicare Other

## 2021-12-01 DIAGNOSIS — F17210 Nicotine dependence, cigarettes, uncomplicated: Secondary | ICD-10-CM

## 2021-12-01 DIAGNOSIS — M25762 Osteophyte, left knee: Secondary | ICD-10-CM | POA: Diagnosis not present

## 2021-12-01 DIAGNOSIS — M1732 Unilateral post-traumatic osteoarthritis, left knee: Secondary | ICD-10-CM | POA: Diagnosis not present

## 2021-12-01 DIAGNOSIS — K219 Gastro-esophageal reflux disease without esophagitis: Secondary | ICD-10-CM | POA: Insufficient documentation

## 2021-12-01 DIAGNOSIS — M2342 Loose body in knee, left knee: Secondary | ICD-10-CM | POA: Insufficient documentation

## 2021-12-01 DIAGNOSIS — G4730 Sleep apnea, unspecified: Secondary | ICD-10-CM | POA: Diagnosis not present

## 2021-12-01 DIAGNOSIS — Z9889 Other specified postprocedural states: Secondary | ICD-10-CM

## 2021-12-01 HISTORY — PX: TOTAL KNEE ARTHROPLASTY: SHX125

## 2021-12-01 SURGERY — ARTHROPLASTY, KNEE, TOTAL
Anesthesia: Spinal | Site: Knee | Laterality: Left

## 2021-12-01 MED ORDER — SODIUM CHLORIDE (PF) 0.9 % IJ SOLN
INTRAMUSCULAR | Status: AC
  Filled 2021-12-01: qty 10

## 2021-12-01 MED ORDER — GLYCOPYRROLATE 0.2 MG/ML IJ SOLN
INTRAMUSCULAR | Status: DC | PRN
  Administered 2021-12-01: .2 mg via INTRAVENOUS

## 2021-12-01 MED ORDER — POVIDONE-IODINE 10 % EX SWAB: 2.0000 "application " | Freq: Once | CUTANEOUS | Status: DC

## 2021-12-01 MED ORDER — LACTATED RINGERS IV BOLUS
250.0000 mL | Freq: Once | INTRAVENOUS | Status: AC
  Administered 2021-12-01: 250 mL via INTRAVENOUS

## 2021-12-01 MED ORDER — METHOCARBAMOL 500 MG IVPB - SIMPLE MED
INTRAVENOUS | Status: AC
  Administered 2021-12-01: 500 mg via INTRAVENOUS
  Filled 2021-12-01: qty 50

## 2021-12-01 MED ORDER — HYDROMORPHONE HCL 1 MG/ML IJ SOLN
INTRAMUSCULAR | Status: AC
  Administered 2021-12-01: 0.5 mg via INTRAVENOUS
  Filled 2021-12-01: qty 1

## 2021-12-01 MED ORDER — ACETAMINOPHEN 10 MG/ML IV SOLN
INTRAVENOUS | Status: AC
  Administered 2021-12-01: 1000 mg via INTRAVENOUS
  Filled 2021-12-01: qty 100

## 2021-12-01 MED ORDER — ONDANSETRON HCL 4 MG/2ML IJ SOLN
INTRAMUSCULAR | Status: DC | PRN
  Administered 2021-12-01: 4 mg via INTRAVENOUS

## 2021-12-01 MED ORDER — LACTATED RINGERS IV SOLN: INTRAVENOUS | Status: DC

## 2021-12-01 MED ORDER — SODIUM CHLORIDE 0.9 % IR SOLN
Status: DC | PRN
  Administered 2021-12-01: 1000 mL

## 2021-12-01 MED ORDER — KETOROLAC TROMETHAMINE 15 MG/ML IJ SOLN
INTRAMUSCULAR | Status: AC
  Filled 2021-12-01: qty 1

## 2021-12-01 MED ORDER — CHLORHEXIDINE GLUCONATE 0.12 % MT SOLN
15.0000 mL | Freq: Once | OROMUCOSAL | Status: AC
  Administered 2021-12-01: 15 mL via OROMUCOSAL

## 2021-12-01 MED ORDER — ACETAMINOPHEN 500 MG PO TABS
1000.0000 mg | ORAL_TABLET | Freq: Once | ORAL | Status: AC
  Administered 2021-12-01: 1000 mg via ORAL
  Filled 2021-12-01: qty 2

## 2021-12-01 MED ORDER — OXYCODONE HCL 5 MG PO TABS: 5.0000 mg | ORAL_TABLET | ORAL | 0 refills | Status: AC | PRN

## 2021-12-01 MED ORDER — DEXAMETHASONE SODIUM PHOSPHATE 10 MG/ML IJ SOLN
8.0000 mg | Freq: Once | INTRAMUSCULAR | Status: AC
  Administered 2021-12-01: 8 mg via INTRAVENOUS

## 2021-12-01 MED ORDER — PROPOFOL 500 MG/50ML IV EMUL
INTRAVENOUS | Status: AC
  Filled 2021-12-01: qty 50

## 2021-12-01 MED ORDER — HYDROMORPHONE HCL 1 MG/ML IJ SOLN
0.2500 mg | INTRAMUSCULAR | Status: DC | PRN
  Administered 2021-12-01 (×2): 0.25 mg via INTRAVENOUS
  Administered 2021-12-01: 0.5 mg via INTRAVENOUS

## 2021-12-01 MED ORDER — WATER FOR IRRIGATION, STERILE IR SOLN
Status: DC | PRN
  Administered 2021-12-01: 2000 mL

## 2021-12-01 MED ORDER — PROPOFOL 500 MG/50ML IV EMUL
INTRAVENOUS | Status: DC | PRN
  Administered 2021-12-01: 100 ug/kg/min via INTRAVENOUS

## 2021-12-01 MED ORDER — ORAL CARE MOUTH RINSE: 15.0000 mL | Freq: Once | OROMUCOSAL | Status: AC

## 2021-12-01 MED ORDER — CEFAZOLIN SODIUM-DEXTROSE 1-4 GM/50ML-% IV SOLN
INTRAVENOUS | Status: AC
  Filled 2021-12-01: qty 50

## 2021-12-01 MED ORDER — AMISULPRIDE (ANTIEMETIC) 5 MG/2ML IV SOLN: 10.0000 mg | Freq: Once | INTRAVENOUS | Status: DC | PRN

## 2021-12-01 MED ORDER — DEXAMETHASONE SODIUM PHOSPHATE 10 MG/ML IJ SOLN
INTRAMUSCULAR | Status: AC
  Filled 2021-12-01: qty 1

## 2021-12-01 MED ORDER — ACETAMINOPHEN 500 MG PO TABS: 1000.0000 mg | ORAL_TABLET | Freq: Three times a day (TID) | ORAL | 0 refills | Status: AC | PRN

## 2021-12-01 MED ORDER — ACETAMINOPHEN 325 MG PO TABS: 325.0000 mg | ORAL_TABLET | Freq: Once | ORAL | Status: DC | PRN

## 2021-12-01 MED ORDER — POVIDONE-IODINE 10 % EX SWAB
2.0000 "application " | Freq: Once | CUTANEOUS | Status: AC
  Administered 2021-12-01: 2 via TOPICAL

## 2021-12-01 MED ORDER — PROPOFOL 1000 MG/100ML IV EMUL
INTRAVENOUS | Status: AC
  Filled 2021-12-01: qty 200

## 2021-12-01 MED ORDER — BUPIVACAINE IN DEXTROSE 0.75-8.25 % IT SOLN
INTRATHECAL | Status: DC | PRN
  Administered 2021-12-01: 1.8 mL via INTRATHECAL

## 2021-12-01 MED ORDER — EPHEDRINE 5 MG/ML INJ
INTRAVENOUS | Status: AC
  Filled 2021-12-01: qty 5

## 2021-12-01 MED ORDER — EPHEDRINE SULFATE-NACL 50-0.9 MG/10ML-% IV SOSY
PREFILLED_SYRINGE | INTRAVENOUS | Status: DC | PRN
  Administered 2021-12-01: 10 mg via INTRAVENOUS
  Administered 2021-12-01: 5 mg via INTRAVENOUS
  Administered 2021-12-01: 10 mg via INTRAVENOUS

## 2021-12-01 MED ORDER — TRANEXAMIC ACID-NACL 1000-0.7 MG/100ML-% IV SOLN
1000.0000 mg | INTRAVENOUS | Status: AC
  Administered 2021-12-01: 1000 mg via INTRAVENOUS
  Filled 2021-12-01: qty 100

## 2021-12-01 MED ORDER — MEPERIDINE HCL 50 MG/ML IJ SOLN: 6.2500 mg | INTRAMUSCULAR | Status: DC | PRN

## 2021-12-01 MED ORDER — PHENYLEPHRINE 40 MCG/ML (10ML) SYRINGE FOR IV PUSH (FOR BLOOD PRESSURE SUPPORT)
PREFILLED_SYRINGE | INTRAVENOUS | Status: DC | PRN
  Administered 2021-12-01 (×3): 80 ug via INTRAVENOUS

## 2021-12-01 MED ORDER — ONDANSETRON HCL 4 MG/2ML IJ SOLN
INTRAMUSCULAR | Status: AC
  Filled 2021-12-01: qty 2

## 2021-12-01 MED ORDER — CELECOXIB 200 MG PO CAPS
400.0000 mg | ORAL_CAPSULE | Freq: Once | ORAL | Status: AC
  Administered 2021-12-01: 400 mg via ORAL
  Filled 2021-12-01: qty 2

## 2021-12-01 MED ORDER — 0.9 % SODIUM CHLORIDE (POUR BTL) OPTIME
TOPICAL | Status: DC | PRN
  Administered 2021-12-01: 1000 mL

## 2021-12-01 MED ORDER — PROMETHAZINE HCL 25 MG/ML IJ SOLN: 6.2500 mg | INTRAMUSCULAR | Status: DC | PRN

## 2021-12-01 MED ORDER — KETOROLAC TROMETHAMINE 15 MG/ML IJ SOLN
15.0000 mg | Freq: Four times a day (QID) | INTRAMUSCULAR | Status: DC
  Administered 2021-12-01: 15 mg via INTRAVENOUS

## 2021-12-01 MED ORDER — BUPIVACAINE LIPOSOME 1.3 % IJ SUSP
INTRAMUSCULAR | Status: DC | PRN
  Administered 2021-12-01: 20 mL

## 2021-12-01 MED ORDER — CELECOXIB 100 MG PO CAPS: 100.0000 mg | ORAL_CAPSULE | Freq: Every day | ORAL | 0 refills | Status: AC

## 2021-12-01 MED ORDER — GLYCOPYRROLATE 0.2 MG/ML IJ SOLN
INTRAMUSCULAR | Status: AC
  Filled 2021-12-01: qty 1

## 2021-12-01 MED ORDER — BUPIVACAINE LIPOSOME 1.3 % IJ SUSP: 20.0000 mL | Freq: Once | INTRAMUSCULAR | Status: DC

## 2021-12-01 MED ORDER — CEFADROXIL 500 MG PO CAPS: 500.0000 mg | ORAL_CAPSULE | Freq: Two times a day (BID) | ORAL | 0 refills | Status: AC

## 2021-12-01 MED ORDER — OXYCODONE HCL 5 MG PO TABS: 5.0000 mg | ORAL_TABLET | ORAL | Status: DC | PRN

## 2021-12-01 MED ORDER — METHOCARBAMOL 500 MG PO TABS: 500.0000 mg | ORAL_TABLET | Freq: Three times a day (TID) | ORAL | 0 refills | Status: AC | PRN

## 2021-12-01 MED ORDER — OXYCODONE HCL 5 MG PO TABS
ORAL_TABLET | ORAL | Status: AC
  Filled 2021-12-01: qty 2

## 2021-12-01 MED ORDER — CEFAZOLIN SODIUM-DEXTROSE 1-4 GM/50ML-% IV SOLN
1.0000 g | Freq: Four times a day (QID) | INTRAVENOUS | Status: DC
  Administered 2021-12-01: 1 g via INTRAVENOUS

## 2021-12-01 MED ORDER — ACETAMINOPHEN 10 MG/ML IV SOLN: 1000.0000 mg | Freq: Once | INTRAVENOUS | Status: DC | PRN

## 2021-12-01 MED ORDER — BUPIVACAINE LIPOSOME 1.3 % IJ SUSP
INTRAMUSCULAR | Status: AC
  Filled 2021-12-01: qty 20

## 2021-12-01 MED ORDER — SODIUM CHLORIDE 0.9% FLUSH
INTRAVENOUS | Status: DC | PRN
  Administered 2021-12-01: 50 mL

## 2021-12-01 MED ORDER — MIDAZOLAM HCL 2 MG/2ML IJ SOLN
1.0000 mg | INTRAMUSCULAR | Status: DC
  Administered 2021-12-01: 2 mg via INTRAVENOUS
  Filled 2021-12-01: qty 2

## 2021-12-01 MED ORDER — HYDROMORPHONE HCL 1 MG/ML IJ SOLN
INTRAMUSCULAR | Status: AC
  Administered 2021-12-01: 0.25 mg via INTRAVENOUS
  Filled 2021-12-01: qty 1

## 2021-12-01 MED ORDER — FENTANYL CITRATE PF 50 MCG/ML IJ SOSY
50.0000 ug | PREFILLED_SYRINGE | INTRAMUSCULAR | Status: DC
  Administered 2021-12-01: 50 ug via INTRAVENOUS
  Filled 2021-12-01: qty 2

## 2021-12-01 MED ORDER — OXYCODONE HCL 5 MG PO TABS
10.0000 mg | ORAL_TABLET | ORAL | Status: DC | PRN
  Administered 2021-12-01: 10 mg via ORAL
  Administered 2021-12-01: 5 mg via ORAL

## 2021-12-01 MED ORDER — ASPIRIN EC 81 MG PO TBEC: 81.0000 mg | DELAYED_RELEASE_TABLET | Freq: Two times a day (BID) | ORAL | 0 refills | Status: AC

## 2021-12-01 MED ORDER — METHOCARBAMOL 500 MG PO TABS: 500.0000 mg | ORAL_TABLET | Freq: Four times a day (QID) | ORAL | Status: DC | PRN

## 2021-12-01 MED ORDER — CEFAZOLIN SODIUM-DEXTROSE 2-4 GM/100ML-% IV SOLN
2.0000 g | INTRAVENOUS | Status: AC
  Administered 2021-12-01: 2 g via INTRAVENOUS
  Filled 2021-12-01: qty 100

## 2021-12-01 MED ORDER — OXYCODONE HCL 5 MG PO TABS
ORAL_TABLET | ORAL | Status: AC
  Filled 2021-12-01: qty 1

## 2021-12-01 MED ORDER — METHOCARBAMOL 500 MG IVPB - SIMPLE MED: 500.0000 mg | Freq: Four times a day (QID) | INTRAVENOUS | Status: DC | PRN

## 2021-12-01 MED ORDER — ACETAMINOPHEN 160 MG/5ML PO SOLN: 325.0000 mg | Freq: Once | ORAL | Status: DC | PRN

## 2021-12-01 MED ORDER — LACTATED RINGERS IV BOLUS
500.0000 mL | Freq: Once | INTRAVENOUS | Status: AC
  Administered 2021-12-01: 500 mL via INTRAVENOUS

## 2021-12-01 SURGICAL SUPPLY — 67 items
ADH SKN CLS APL DERMABOND .7 (GAUZE/BANDAGES/DRESSINGS) ×1
APL PRP STRL LF DISP 70% ISPRP (MISCELLANEOUS) ×2
BAG COUNTER SPONGE SURGICOUNT (BAG) ×1 IMPLANT
BAG SPNG CNTER NS LX DISP (BAG) ×1
BAG SURGICOUNT SPONGE COUNTING (BAG) ×1
BLADE HEX COATED 2.75 (ELECTRODE) ×3 IMPLANT
BLADE SAG 18X100X1.27 (BLADE) ×3 IMPLANT
BLADE SAW SAG 35X64 .89 (BLADE) ×3 IMPLANT
BLADE SAW SGTL 18X1.27X75 (BLADE) ×2 IMPLANT
BLADE SAW SGTL 18X1.27X75MM (BLADE) ×2
BNDG CMPR MED 10X6 ELC LF (GAUZE/BANDAGES/DRESSINGS) ×1
BNDG ELASTIC 6X10 VLCR STRL LF (GAUZE/BANDAGES/DRESSINGS) ×3 IMPLANT
BOWL SMART MIX CTS (DISPOSABLE) ×3 IMPLANT
BSPLAT TIB E 2 PG STRL KN LT (Knees) ×1 IMPLANT
CEMENT BONE R 1X40 (Cement) ×4 IMPLANT
CHLORAPREP W/TINT 26 (MISCELLANEOUS) ×6 IMPLANT
CLOSURE WOUND 1/2 X4 (GAUZE/BANDAGES/DRESSINGS) ×1
CMPNT FEM 5 STD CRCTE RTN STRL (Joint) ×1 IMPLANT
COMP FEM STD PS 5 LT (Joint) ×3 IMPLANT
COMP PATELLA 32 STD 8.5 THK (Orthopedic Implant) IMPLANT
COMPONENT FEM 5 CRCTE RTN STR (Joint) IMPLANT
COMPONENT TIB 2 PEG SZ E LT KN (Knees) IMPLANT
COOLER ICEMAN CLASSIC (MISCELLANEOUS) IMPLANT
COVER SURGICAL LIGHT HANDLE (MISCELLANEOUS) ×3 IMPLANT
CUFF TOURN SGL QUICK 34 (TOURNIQUET CUFF) ×3
CUFF TRNQT CYL 34X4.125X (TOURNIQUET CUFF) ×1 IMPLANT
DECANTER SPIKE VIAL GLASS SM (MISCELLANEOUS) ×3 IMPLANT
DERMABOND ADVANCED (GAUZE/BANDAGES/DRESSINGS) ×2
DERMABOND ADVANCED .7 DNX12 (GAUZE/BANDAGES/DRESSINGS) ×1 IMPLANT
DRAPE INCISE IOBAN 66X45 STRL (DRAPES) ×2 IMPLANT
DRAPE INCISE IOBAN 85X60 (DRAPES) ×3 IMPLANT
DRAPE SHEET LG 3/4 BI-LAMINATE (DRAPES) ×3 IMPLANT
DRAPE U-SHAPE 47X51 STRL (DRAPES) ×3 IMPLANT
DRESSING AQUACEL AG SP 3.5X10 (GAUZE/BANDAGES/DRESSINGS) ×1 IMPLANT
DRSG AQUACEL AG ADV 3.5X10 (GAUZE/BANDAGES/DRESSINGS) ×2 IMPLANT
DRSG AQUACEL AG SP 3.5X10 (GAUZE/BANDAGES/DRESSINGS) ×3
GLOVE SRG 8 PF TXTR STRL LF DI (GLOVE) ×1 IMPLANT
GLOVE SURG ENC MOIS LTX SZ8 (GLOVE) ×6 IMPLANT
GLOVE SURG UNDER POLY LF SZ8 (GLOVE) ×3
GOWN STRL REUS W/TWL XL LVL3 (GOWN DISPOSABLE) ×3 IMPLANT
HANDPIECE INTERPULSE COAX TIP (DISPOSABLE) ×3
HOOD PEEL AWAY FLYTE STAYCOOL (MISCELLANEOUS) ×9 IMPLANT
IRRIGATION SURGIPHOR STRL (IV SOLUTION) ×3 IMPLANT
MANIFOLD NEPTUNE II (INSTRUMENTS) ×3 IMPLANT
MARKER SKIN DUAL TIP RULER LAB (MISCELLANEOUS) ×6 IMPLANT
NS IRRIG 1000ML POUR BTL (IV SOLUTION) ×3 IMPLANT
PACK TOTAL KNEE CUSTOM (KITS) ×3 IMPLANT
PAD COLD SHLDR WRAP-ON (PAD) IMPLANT
PATELLA ZIMMER 32MM (Orthopedic Implant) ×3 IMPLANT
PROTECTOR NERVE ULNAR (MISCELLANEOUS) ×3 IMPLANT
SCREW HEADED 33MM KNEE (MISCELLANEOUS) ×4 IMPLANT
SCREW HEADED 48MM KNEE (MISCELLANEOUS) ×4 IMPLANT
SET HNDPC FAN SPRY TIP SCT (DISPOSABLE) ×1 IMPLANT
STEM ASF PS 12 4-5/EF LT (Stem) ×2 IMPLANT
STRIP CLOSURE SKIN 1/2X4 (GAUZE/BANDAGES/DRESSINGS) ×1 IMPLANT
SUT MNCRL AB 3-0 PS2 18 (SUTURE) ×3 IMPLANT
SUT STRATAFIX 0 PDS 27 VIOLET (SUTURE) ×3
SUT STRATAFIX 1PDS 45CM VIOLET (SUTURE) ×3 IMPLANT
SUT STRATAFIX PDO 1 14 VIOLET (SUTURE) ×3
SUT STRATFX PDO 1 14 VIOLET (SUTURE) ×1
SUT VIC AB 2-0 CT2 27 (SUTURE) ×6 IMPLANT
SUTURE STRATFX 0 PDS 27 VIOLET (SUTURE) ×1 IMPLANT
SUTURE STRATFX PDO 1 14 VIOLET (SUTURE) IMPLANT
SYR 50ML LL SCALE MARK (SYRINGE) ×3 IMPLANT
TIBIA POR 2 PEG SZ E L KNEE (Knees) ×3 IMPLANT
TRAY FOLEY MTR SLVR 16FR STAT (SET/KITS/TRAYS/PACK) ×2 IMPLANT
TUBE SUCTION HIGH CAP CLEAR NV (SUCTIONS) ×3 IMPLANT

## 2021-12-01 NOTE — Progress Notes (Signed)
Assisted Dr. Hollis with left, ultrasound guided, adductor canal block. Side rails up, monitors on throughout procedure. See vital signs in flow sheet. Tolerated Procedure well.  

## 2021-12-01 NOTE — Transfer of Care (Signed)
Immediate Anesthesia Transfer of Care Note  Patient: Shannon Pace  Procedure(s) Performed: TOTAL KNEE ARTHROPLASTY (Left: Knee)  Patient Location: PACU  Anesthesia Type:MAC, Regional and Spinal  Level of Consciousness: awake, alert , oriented and patient cooperative  Airway & Oxygen Therapy: Patient Spontanous Breathing  Post-op Assessment: Report given to RN and Post -op Vital signs reviewed and stable  Post vital signs: Reviewed and stable  Last Vitals:  Vitals Value Taken Time  BP 106/76 12/01/21 1319  Temp    Pulse 74 12/01/21 1322  Resp 13 12/01/21 1322  SpO2 88 % 12/01/21 1322  Vitals shown include unvalidated device data.  Last Pain:  Vitals:   12/01/21 0924  TempSrc:   PainSc: Asleep      Patients Stated Pain Goal: 4 (53/20/23 3435)  Complications: No notable events documented.

## 2021-12-01 NOTE — Anesthesia Procedure Notes (Signed)
Anesthesia Regional Block: Adductor canal block   Pre-Anesthetic Checklist: , timeout performed,  Correct Patient, Correct Site, Correct Laterality,  Correct Procedure, Correct Position, site marked,  Risks and benefits discussed,  Surgical consent,  Pre-op evaluation,  At surgeon's request and post-op pain management  Laterality: Left  Prep: chloraprep       Needles:  Injection technique: Single-shot  Needle Type: Echogenic Stimulator Needle     Needle Length: 9cm  Needle Gauge: 21     Additional Needles:   Procedures:,,,, ultrasound used (permanent image in chart),,    Narrative:  Start time: 12/01/2021 10:00 AM End time: 12/01/2021 10:05 AM Injection made incrementally with aspirations every 5 mL.  Performed by: Personally  Anesthesiologist: Shelton Silvas, MD  Additional Notes: Patient tolerated the procedure well. Local anesthetic introduced in an incremental fashion under minimal resistance after negative aspirations. No paresthesias were elicited. After completion of the procedure, no acute issues were identified and patient continued to be monitored by RN.

## 2021-12-01 NOTE — Anesthesia Procedure Notes (Signed)
Spinal  Start time: 12/01/2021 10:46 AM End time: 12/01/2021 10:48 AM Reason for block: surgical anesthesia Staffing Performed: anesthesiologist  Anesthesiologist: Shelton Silvas, MD Preanesthetic Checklist Completed: patient identified, IV checked, site marked, risks and benefits discussed, surgical consent, monitors and equipment checked, pre-op evaluation and timeout performed Spinal Block Patient position: sitting Prep: DuraPrep and site prepped and draped Location: L3-4 Injection technique: single-shot Needle Needle type: Pencan  Needle gauge: 24 G Needle length: 10 cm Needle insertion depth: 10 cm Additional Notes Patient tolerated well. No immediate complications.

## 2021-12-01 NOTE — Op Note (Signed)
DATE OF SURGERY:  12/01/2021 TIME: 12:56 PM  PATIENT NAME:  Shannon Pace   AGE: 45 y.o.    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   OPERATIVE IMPLANTS:  Implant Name Type Inv. Item Serial No. Manufacturer Lot No. LRB No. Used Action  CEMENT BONE R 1X40 - [id number] Cement CEMENT BONE R 1X40  ZIMMER RECON(ORTH,TRAU,BIO,SG) [id number] Left 1 Implanted  TIBIA POR 2 PEG SZ E L KNEE - [id number] Knees TIBIA POR 2 PEG SZ E L KNEE  ZIMMER RECON(ORTH,TRAU,BIO,SG) [id number] Left 1 Implanted  PATELLA ZIMMER - [id number] Orthopedic Implant PATELLA ZIMMER  ZIMMER RECON(ORTH,TRAU,BIO,SG) [id number] Left 1 Implanted  Femur Trabecular CR left size 5 ref# [id number]    ZIMMER KNEE [id number] Left 1 Implanted  STEM ASF PS 12 4-5/EF LT - [id number] Stem STEM ASF PS 12 4-5/EF LT  ZIMMER RECON(ORTH,TRAU,BIO,SG) [id number] Left 1 Implanted      PREOPERATIVE INDICATIONS:  Shannon Pace is a 45 y.o. year old male with end stage bone on bone degenerative arthritis of the knee who failed conservative treatment, including injections, antiinflammatories, activity modification, and assistive devices, and had significant impairment of their activities of daily living, and elected for Total Knee Arthroplasty.   The risks, benefits, and alternatives were discussed at length including but not limited to the risks of infection, bleeding, nerve injury, stiffness, blood clots, the need for revision surgery, cardiopulmonary complications, among others, and they were willing to proceed.  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  Given patient's young age good bone quality, activity level elected to use press-fit femoral and tibial components to allow for bony ingrowth.  ESTIMATED BLOOD LOSS: 50cc  OPERATIVE DESCRIPTION:   Once adequate anesthesia, preoperative antibiotics, 2 gm of ancef,1 gm of Tranexamic Acid, and 8 mg of Decadron administered, the patient was positioned supine with a left thigh tourniquet placed.  The left lower extremity was prepped and draped in sterile fashion.  A time-  out was performed identifying the patient, planned procedure, and the appropriate extremity.     The leg was  exsanguinated, tourniquet elevated to 250 mmHg.  A midline incision was made followed by median parapatellar arthrotomy. Anterior horn of the medial meniscus was released and resected. A medial release was performed, the infrapatellar fat pad was resected with care taken to protect the patellar tendon. The suprapatellar fat was removed to exposed the distal anterior femur. The anterior horn of the lateral meniscus was  released.  The ACL was absent.  Following initial  exposure, attention was first to the femur.  The femoral   canal was opened with a drill, irrigated to try to prevent fat emboli.  An   intramedullary rod was passed set at 5 degrees valgus, 11 mm. The distal femur was resected.  Following this resection, the tibia was   subluxated anteriorly.  Using the extramedullary guide, 10 mm of bone was resected off   the proximal lateral tibia.  The extension gap appeared too small for the 10 mm spacer block however elected to complete the femur and assess the flexion gap and to remove posterior osteophytes before taking additional bone.     Once this was  done, the posterior femoral referencing femoral sizer was placed under to the posterior condyles with 3 degrees of external rotational. The femur was sized to be a size 5 in the anterior-  posterior dimension. The   anterior, posterior, and  chamfer cuts were  made without difficulty nor   notching making certain that I was along the anterior cortex to help   with flexion gap stability.  Using an osteotome and Cobb we first remove posterior osteophytes and then did a posterior capsular release medially and laterally. Diluted Exparel was injected into the posterior capsule.      At this point, the tibia was sized to be a size E.  The size E tray was   then pinned in position. Trial reduction was now carried with a 5 femur,  E tibia, a 10 mm MC insert.  The components were felt to be too tight in both flexion extension with with a residual flexion contracture.  Elected to take additional 2 mm resection off the proximal tibia.  Components were then inserted again and at this point we had full extension.  And were happy with varus valgus stability in both flexion and extension with a 12 mm MC poly.  The PCL was partially released to balance the knee in flexion.  Attention was next directed to the patella.  Precut  measurement was noted to be 23 mm.  I resected down to 14 mm and used a  56mm patellar button to restore patellar height as well as cover the cut surface.     The patella lug holes were drilled and a 32 mm patella poly trial was placed.    The knee was brought to full extension with good flexion stability with the patella   tracking through the trochlea without application of pressure.     Next the femoral component was again assessed and determined to be seated and appropriately lateralized.  The femoral lug holes were drilled.  The femoral component was then removed.Tibial component was again assessed and felt to be seated and appropriately rotated with the medial third of the tubercle. The tibial pegs were then drilled.   Final components were  opened and and impacted with excellent stability.the cement was mixed for the patellar component was cemented into place.  The synovial lining was  then injected a dilute Exparel.     After assessing  stability in flexion extension with the final component a 12 mm MC poly insert was chosen.  It was   placed into the knee.         The tourniquet had been let down at 90 minutes.  No significant   hemostasis was required.  The medial parapatellar arthrotomy was then reapproximated using a #1 Stratafix sutures with the knee  in flexion.  The   remaining wound was closed with 0 stratafix, 2-0 Vicryl, and running 3-0 Monocryl.   The knee was cleaned, dried, dressed sterilely using Dermabond and   Aquacel dressing.  The patient was then  brought to recovery room in stable condition, tolerating the procedure  well. There were no complications.   Post op recs: WB: WBAT Abx: ancef x23 hours post op, extended antibiotics with cefadroxil 500 twice daily x7 days given the patient's history of prior surgery and nicotine use. Imaging: PACU xrays DVT prophylaxis: Aspirin 81mg  BID x4 weeks Follow up: 2 weeks after surgery for a wound check with Dr. at [facility].  Address: [address], [place], [place] [address]  Office Phone: [phone]  Weber Cooks, MD Orthopaedic Surgery

## 2021-12-01 NOTE — Anesthesia Preprocedure Evaluation (Addendum)
Anesthesia Evaluation  Patient identified by MRN, date of birth, ID band Patient awake    Reviewed: Allergy & Precautions, NPO status , Patient's Chart, lab work & pertinent test results  Airway Mallampati: II  TM Distance: >3 FB Neck ROM: Full    Dental  (+) Teeth Intact, Dental Advisory Given   Pulmonary sleep apnea , former smoker,    breath sounds clear to auscultation       Cardiovascular  Rhythm:Regular Rate:Normal     Neuro/Psych  Headaches, Seizures -,  PSYCHIATRIC DISORDERS Anxiety Depression    GI/Hepatic Neg liver ROS, GERD  ,  Endo/Other  negative endocrine ROS  Renal/GU negative Renal ROS     Musculoskeletal  (+) Arthritis ,   Abdominal Normal abdominal exam  (+)   Peds  Hematology negative hematology ROS (+)   Anesthesia Other Findings   Reproductive/Obstetrics                            Anesthesia Physical Anesthesia Plan  ASA: 2  Anesthesia Plan: Spinal   Post-op Pain Management: Regional block   Induction: Intravenous  PONV Risk Score and Plan: 2 and Ondansetron, Propofol infusion and Midazolam  Airway Management Planned:   Additional Equipment: None  Intra-op Plan:   Post-operative Plan:   Informed Consent: I have reviewed the patients History and Physical, chart, labs and discussed the procedure including the risks, benefits and alternatives for the proposed anesthesia with the patient or authorized representative who has indicated his/her understanding and acceptance.     Dental advisory given  Plan Discussed with: CRNA  Anesthesia Plan Comments:        Anesthesia Quick Evaluation

## 2021-12-01 NOTE — Progress Notes (Signed)
Orthopedic Tech Progress Note Patient Details:  Shannon Pace 06/11/1976 [id number]  Ortho Devices Type of Ortho Device: Bone foam zero knee Ortho Device/Splint Interventions: Ordered      Shannon Pace 12/01/2021, 3:02 PM

## 2021-12-01 NOTE — Evaluation (Signed)
Physical Therapy Evaluation Patient Details Name: Shannon Pace MRN: [id number] DOB: 1976/06/28 Today's Date: 12/01/2021  History of Present Illness  Pt s/p L TKR and with hx of TBI, seizure and scoliosis  Clinical Impression  Pt s/p L TKR and presents with decreased L LE strength/ROM and significant post op pain limiting functional mobility.  Pt should progress to dc home with family assist and reports first OP PT appt 12/04/21.   This date, pt performed ltd HEP with written instruction provided, ambulated 6' in hall and negotiated stairs.  Pt and spouse expressing concern regarding pain control.     Recommendations for follow up therapy are one component of a multi-disciplinary discharge planning process, led by the attending physician.  Recommendations may be updated based on patient status, additional functional criteria and insurance authorization.  Follow Up Recommendations Follow physician's recommendations for discharge plan and follow up therapies    Assistance Recommended at Discharge Frequent or constant Supervision/Assistance  Functional Status Assessment Patient has had a recent decline in their functional status and demonstrates the ability to make significant improvements in function in a reasonable and predictable amount of time.  Equipment Recommendations  Rolling walker (2 wheels)    Recommendations for Other Services       Precautions / Restrictions Precautions Precautions: Knee;Fall Restrictions Weight Bearing Restrictions: No Other Position/Activity Restrictions: WBAT      Mobility  Bed Mobility Overal bed mobility: Needs Assistance Bed Mobility: Supine to Sit     Supine to sit: Min assist     General bed mobility comments: Increased time with cues for sequence and use of R LE to self assist; physical assist to manage L LE    Transfers Overall transfer level: Needs assistance Equipment used: Rolling walker (2 wheels) Transfers: Sit to/from  Stand Sit to Stand: Min assist;Min guard           General transfer comment: Steady assist with cues for LE management and use of UEs to self assist    Ambulation/Gait Ambulation/Gait assistance: Min guard Gait Distance (Feet): 90 Feet Assistive device: Rolling walker (2 wheels) Gait Pattern/deviations: Step-to pattern;Decreased step length - right;Decreased step length - left;Shuffle;Trunk flexed Gait velocity: decr     General Gait Details: cues for sequence, posture, position from RW and increased heel contact on L  Stairs Stairs: Yes Stairs assistance: Min assist Stair Management: No rails;One rail Left;Step to pattern;Backwards;Forwards;With walker;With crutches Number of Stairs: 4 General stair comments: 2 steps fwd with rail and crutch; 2 steps bkwd with RW; cues for sequence and foot/crutch/RW placement  Wheelchair Mobility    Modified Rankin (Stroke Patients Only)       Balance Overall balance assessment: Needs assistance Sitting-balance support: No upper extremity supported;Feet supported Sitting balance-Leahy Scale: Good     Standing balance support: Bilateral upper extremity supported Standing balance-Leahy Scale: Poor                               Pertinent Vitals/Pain Pain Assessment: 0-10 Pain Score: 8  Pain Location: L knee Pain Descriptors / Indicators: Aching;Grimacing;Sore Pain Intervention(s): Limited activity within patient's tolerance;Monitored during session;Premedicated before session    Home Living Family/patient expects to be discharged to:: Private residence Living Arrangements: Spouse/significant other;Children Available Help at Discharge: Family Type of Home: House Home Access: Stairs to enter Entrance Stairs-Rails: Left (vs 2 steps and no rails) Entrance Stairs-Number of Steps: 3   Home Layout: One level Home  Equipment: None      Prior Function Prior Level of Function : Independent/Modified Independent                      Hand Dominance        Extremity/Trunk Assessment   Upper Extremity Assessment Upper Extremity Assessment: Overall WFL for tasks assessed    Lower Extremity Assessment Lower Extremity Assessment: LLE deficits/detail LLE Deficits / Details: AAROM at knee -5 - 20 with 2+/5 quads    Cervical / Trunk Assessment Cervical / Trunk Assessment: Normal  Communication   Communication: No difficulties  Cognition Arousal/Alertness: Awake/alert Behavior During Therapy: WFL for tasks assessed/performed Overall Cognitive Status: Within Functional Limits for tasks assessed                                          General Comments      Exercises Total Joint Exercises Ankle Circles/Pumps: AROM;Both;15 reps;Supine Quad Sets: AROM;Both;10 reps;Supine Heel Slides: AAROM;Left;10 reps;Supine Straight Leg Raises: AAROM;Left;10 reps;Supine   Assessment/Plan    PT Assessment Patient needs continued PT services  PT Problem List Decreased strength;Decreased range of motion;Decreased activity tolerance;Decreased balance;Decreased mobility;Decreased knowledge of use of DME;Pain       PT Treatment Interventions Gait training;Stair training;Functional mobility training;Therapeutic activities;DME instruction;Therapeutic exercise;Patient/family education    PT Goals (Current goals can be found in the Care Plan section)  Acute Rehab PT Goals Patient Stated Goal: Decreased pain; regain IND PT Goal Formulation: With patient Time For Goal Achievement: 12/08/21 Potential to Achieve Goals: Good    Frequency 7X/week   Barriers to discharge        Co-evaluation               AM-PAC PT "6 Clicks" Mobility  Outcome Measure Help needed turning from your back to your side while in a flat bed without using bedrails?: A Little Help needed moving from lying on your back to sitting on the side of a flat bed without using bedrails?: A Little Help needed moving  to and from a bed to a chair (including a wheelchair)?: A Little Help needed standing up from a chair using your arms (e.g., wheelchair or bedside chair)?: A Little Help needed to walk in hospital room?: A Little Help needed climbing 3-5 steps with a railing? : A Little 6 Click Score: 18    End of Session Equipment Utilized During Treatment: Gait belt Activity Tolerance: Patient tolerated treatment well;Patient limited by pain Patient left: in chair;with call bell/phone within reach;with family/visitor present Nurse Communication: Mobility status PT Visit Diagnosis: Difficulty in walking, not elsewhere classified (R26.2);Pain Pain - Right/Left: Left Pain - part of body: Knee    Time: 1547-1650 PT Time Calculation (min) (ACUTE ONLY): 63 min   Charges:   PT Evaluation $PT Eval Low Complexity: 1 Low PT Treatments $Gait Training: 8-22 mins $Therapeutic Exercise: 8-22 mins        Mauro Kaufmann PT Acute Rehabilitation Services Pager [phone] Office [phone]   Yuvaan Olander 12/01/2021, 5:03 PM

## 2021-12-01 NOTE — Discharge Instructions (Signed)

## 2021-12-01 NOTE — Interval H&P Note (Signed)
The patient has been re-examined, and the chart reviewed, and there have been no interval changes to the documented history and physical.  The patient states that he has quit >90% nicotine use. He understands increased risk risk of wound healing complications and infection with nicotine use before and after the surgery..  And that infection cannot be treated with antibiotics but would mean additional surgery including possible removal of his implants, and revision of his components.   The risks, benefits, and alternatives have been discussed at length with patient, and the patient is willing to proceed.  Left knee marked. Consent has been signed.

## 2021-12-04 ENCOUNTER — Encounter (HOSPITAL_COMMUNITY): Payer: Self-pay | Admitting: Orthopedic Surgery

## 2021-12-04 NOTE — Anesthesia Postprocedure Evaluation (Signed)
Anesthesia Post Note  Patient: Shannon Pace  Procedure(s) Performed: TOTAL KNEE ARTHROPLASTY (Left: Knee)     Patient location during evaluation: PACU Anesthesia Type: Spinal Level of consciousness: oriented and awake and alert Pain management: pain level controlled Vital Signs Assessment: post-procedure vital signs reviewed and stable Respiratory status: spontaneous breathing, respiratory function stable and patient connected to nasal cannula oxygen Cardiovascular status: blood pressure returned to baseline and stable Postop Assessment: no headache, no backache and no apparent nausea or vomiting Anesthetic complications: no   No notable events documented.            Shelton Silvas

## 2021-12-08 ENCOUNTER — Other Ambulatory Visit (HOSPITAL_COMMUNITY): Payer: Self-pay | Admitting: Physician Assistant

## 2021-12-08 ENCOUNTER — Other Ambulatory Visit: Payer: Self-pay

## 2021-12-08 ENCOUNTER — Ambulatory Visit (HOSPITAL_COMMUNITY)
Admission: RE | Admit: 2021-12-08 | Discharge: 2021-12-08 | Disposition: A | Discharge status: Home / Self Care | Payer: Medicare Other | Source: Ambulatory Visit | Attending: Cardiovascular Disease | Admitting: Cardiovascular Disease

## 2021-12-08 DIAGNOSIS — M7989 Other specified soft tissue disorders: Secondary | ICD-10-CM | POA: Insufficient documentation

## 2021-12-08 DIAGNOSIS — M79605 Pain in left leg: Secondary | ICD-10-CM | POA: Diagnosis present

## 2022-01-16 DIAGNOSIS — Z0271 Encounter for disability determination: Secondary | ICD-10-CM

## 2022-02-02 ENCOUNTER — Other Ambulatory Visit: Payer: Self-pay

## 2022-02-02 ENCOUNTER — Encounter (HOSPITAL_BASED_OUTPATIENT_CLINIC_OR_DEPARTMENT_OTHER): Payer: Self-pay | Admitting: Orthopedic Surgery

## 2022-02-09 ENCOUNTER — Ambulatory Visit (HOSPITAL_COMMUNITY): Payer: Medicare Other

## 2022-02-09 ENCOUNTER — Encounter (HOSPITAL_BASED_OUTPATIENT_CLINIC_OR_DEPARTMENT_OTHER)
Admission: RE | Disposition: A | Discharge status: Home / Self Care | Payer: Self-pay | Source: Home / Self Care | Attending: Orthopedic Surgery

## 2022-02-09 ENCOUNTER — Other Ambulatory Visit: Payer: Self-pay

## 2022-02-09 ENCOUNTER — Encounter (HOSPITAL_BASED_OUTPATIENT_CLINIC_OR_DEPARTMENT_OTHER): Payer: Self-pay | Admitting: Orthopedic Surgery

## 2022-02-09 ENCOUNTER — Ambulatory Visit (HOSPITAL_BASED_OUTPATIENT_CLINIC_OR_DEPARTMENT_OTHER): Payer: Medicare Other | Admitting: Certified Registered"

## 2022-02-09 ENCOUNTER — Ambulatory Visit (HOSPITAL_BASED_OUTPATIENT_CLINIC_OR_DEPARTMENT_OTHER)
Admission: RE | Admit: 2022-02-09 | Discharge: 2022-02-09 | Disposition: A | Discharge status: Home / Self Care | Payer: Medicare Other | Attending: Orthopedic Surgery | Admitting: Orthopedic Surgery

## 2022-02-09 DIAGNOSIS — T84093A Other mechanical complication of internal left knee prosthesis, initial encounter: Secondary | ICD-10-CM | POA: Diagnosis not present

## 2022-02-09 DIAGNOSIS — Z96652 Presence of left artificial knee joint: Secondary | ICD-10-CM | POA: Diagnosis not present

## 2022-02-09 DIAGNOSIS — Z9889 Other specified postprocedural states: Secondary | ICD-10-CM | POA: Diagnosis not present

## 2022-02-09 DIAGNOSIS — M24662 Ankylosis, left knee: Secondary | ICD-10-CM | POA: Diagnosis present

## 2022-02-09 DIAGNOSIS — F418 Other specified anxiety disorders: Secondary | ICD-10-CM

## 2022-02-09 DIAGNOSIS — Z87891 Personal history of nicotine dependence: Secondary | ICD-10-CM | POA: Diagnosis not present

## 2022-02-09 DIAGNOSIS — G4730 Sleep apnea, unspecified: Secondary | ICD-10-CM

## 2022-02-09 DIAGNOSIS — Z8669 Personal history of other diseases of the nervous system and sense organs: Secondary | ICD-10-CM | POA: Diagnosis not present

## 2022-02-09 DIAGNOSIS — F419 Anxiety disorder, unspecified: Secondary | ICD-10-CM | POA: Insufficient documentation

## 2022-02-09 DIAGNOSIS — K219 Gastro-esophageal reflux disease without esophagitis: Secondary | ICD-10-CM | POA: Diagnosis not present

## 2022-02-09 DIAGNOSIS — F32A Depression, unspecified: Secondary | ICD-10-CM | POA: Diagnosis not present

## 2022-02-09 HISTORY — PX: KNEE CLOSED REDUCTION: SHX995

## 2022-02-09 HISTORY — DX: Sleep apnea, unspecified: G47.30

## 2022-02-09 SURGERY — MANIPULATION, KNEE, CLOSED
Anesthesia: Regional | Site: Knee | Laterality: Left

## 2022-02-09 MED ORDER — FENTANYL CITRATE (PF) 100 MCG/2ML IJ SOLN
INTRAMUSCULAR | Status: AC
  Filled 2022-02-09: qty 2

## 2022-02-09 MED ORDER — ROPIVACAINE HCL 7.5 MG/ML IJ SOLN
INTRAMUSCULAR | Status: DC | PRN
  Administered 2022-02-09: 20 mL via PERINEURAL

## 2022-02-09 MED ORDER — DEXAMETHASONE SODIUM PHOSPHATE 10 MG/ML IJ SOLN
INTRAMUSCULAR | Status: AC
  Filled 2022-02-09: qty 1

## 2022-02-09 MED ORDER — PROPOFOL 10 MG/ML IV BOLUS
INTRAVENOUS | Status: DC | PRN
  Administered 2022-02-09: 200 mg via INTRAVENOUS

## 2022-02-09 MED ORDER — LACTATED RINGERS IV SOLN: INTRAVENOUS | Status: DC

## 2022-02-09 MED ORDER — ACETAMINOPHEN 10 MG/ML IV SOLN: 1000.0000 mg | Freq: Once | INTRAVENOUS | Status: DC | PRN

## 2022-02-09 MED ORDER — DEXAMETHASONE SODIUM PHOSPHATE 10 MG/ML IJ SOLN: 8.0000 mg | Freq: Once | INTRAMUSCULAR | Status: DC

## 2022-02-09 MED ORDER — SUCCINYLCHOLINE CHLORIDE 200 MG/10ML IV SOSY
PREFILLED_SYRINGE | INTRAVENOUS | Status: DC | PRN
  Administered 2022-02-09: 100 mg via INTRAVENOUS

## 2022-02-09 MED ORDER — LIDOCAINE HCL (CARDIAC) PF 100 MG/5ML IV SOSY
PREFILLED_SYRINGE | INTRAVENOUS | Status: DC | PRN
  Administered 2022-02-09: 60 mg via INTRAVENOUS

## 2022-02-09 MED ORDER — MIDAZOLAM HCL 2 MG/2ML IJ SOLN
INTRAMUSCULAR | Status: AC
  Filled 2022-02-09: qty 2

## 2022-02-09 MED ORDER — FENTANYL CITRATE (PF) 100 MCG/2ML IJ SOLN
100.0000 ug | Freq: Once | INTRAMUSCULAR | Status: AC
  Administered 2022-02-09: 100 ug via INTRAVENOUS

## 2022-02-09 MED ORDER — MIDAZOLAM HCL 2 MG/2ML IJ SOLN
2.0000 mg | Freq: Once | INTRAMUSCULAR | Status: AC
  Administered 2022-02-09: 2 mg via INTRAVENOUS

## 2022-02-09 MED ORDER — DEXAMETHASONE SODIUM PHOSPHATE 4 MG/ML IJ SOLN
INTRAMUSCULAR | Status: DC | PRN
Start: 2022-02-09 — End: 2022-02-09
  Administered 2022-02-09: 4 mg via INTRAVENOUS

## 2022-02-09 MED ORDER — OXYCODONE HCL 5 MG PO TABS: 5.0000 mg | ORAL_TABLET | Freq: Three times a day (TID) | ORAL | 0 refills | Status: AC | PRN

## 2022-02-09 MED ORDER — FENTANYL CITRATE (PF) 100 MCG/2ML IJ SOLN
25.0000 ug | INTRAMUSCULAR | Status: DC | PRN
  Administered 2022-02-09 (×2): 50 ug via INTRAVENOUS

## 2022-02-09 SURGICAL SUPPLY — 18 items
GLOVE SURG ENC MOIS LTX SZ7 (GLOVE) ×1 IMPLANT
GLOVE SURG MICRO LTX SZ7.5 (GLOVE) IMPLANT
GLOVE SURG UNDER POLY LF SZ7 (GLOVE) IMPLANT
GLOVE SURG UNDER POLY LF SZ7.5 (GLOVE) IMPLANT
GOWN STRL REUS W/ TWL LRG LVL3 (GOWN DISPOSABLE) ×2 IMPLANT
GOWN STRL REUS W/ TWL XL LVL3 (GOWN DISPOSABLE) ×1 IMPLANT
GOWN STRL REUS W/TWL LRG LVL3 (GOWN DISPOSABLE)
GOWN STRL REUS W/TWL XL LVL3 (GOWN DISPOSABLE)
NDL SAFETY ECLIPSE 18X1.5 (NEEDLE) ×1 IMPLANT
NDL SPNL 22GX3.5 QUINCKE BK (NEEDLE) IMPLANT
NEEDLE HYPO 18GX1.5 SHARP (NEEDLE)
NEEDLE HYPO 22GX1.5 SAFETY (NEEDLE) ×1 IMPLANT
NEEDLE SPNL 22GX3.5 QUINCKE BK (NEEDLE) IMPLANT
PAD ALCOHOL SWAB (MISCELLANEOUS) ×2 IMPLANT
SPIKE FLUID TRANSFER (MISCELLANEOUS) IMPLANT
SWABSTICK POVIDONE IODINE SNGL (MISCELLANEOUS) IMPLANT
SYR CONTROL 10ML LL (SYRINGE) ×2 IMPLANT
WRAP KNEE MAXI GEL POST OP (GAUZE/BANDAGES/DRESSINGS) IMPLANT

## 2022-02-09 NOTE — H&P (Signed)
SUBJECTIVE:   He returns for followup after left total knee arthroplasty on 12/01/2021.  He is now two months out from surgery.  We have been following him closely given significant stiffness in the knee.  He feels since we last saw him about two weeks ago that he has not made any more progress with flexion.  He states at times physical therapy can get him close to 100, but he can only get to about 90 degrees with flexion without significant difficulty currently.  Preop and intraop motion were 0-120. He is ambulating without an assistive device.  His pain overall is well controlled and gradually getting better.    OBJECTIVE: He has active range of motion approximately 0 to 90 degrees.  Incision has healed well.  Minimal effusion.  Stable to varus and valgus stress.  He ambulates with a nonantalgic gait.  Neurovascularly intact distally.  X-RAYS: Xrays in clinic show total knee arthroplasty components in good position without adverse features.   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    PLAN: I discussed with the patient that given his plateaued range of motion at this point I would recommend a manipulation.  Preoperatively he had 0 to 120, as well as intraoperatively.  I feel he should be able to get better flexion.  With manipulation I anticipate him to improve. He will go to physical therapy Saturday, Monday and Wednesday afterwards.  I additionally prescribed some Celebrex for him to take before and after the manipulation to help with his inflammation after surgery.  We discussed risks of manipulation, including pain, recurrence of stiffness, risk of fracture.  He is in agreement with proceeding with this procedure.  All questions were answered.

## 2022-02-09 NOTE — Discharge Instructions (Addendum)
Diet: As you were doing prior to hospitalization   Shower:  no restrictions   Dressing:  none  Activity:  Increase activity slowly as tolerated, but follow the weight bearing instructions below.  The rules on driving is that you can not be taking narcotics while you drive, and you must feel in control of the vehicle.    Weight Bearing:   weight bearing as tolerated. Aggressive range of motion exercises.    To prevent constipation: you may use a stool softener such as -  Colace (over the counter) 100 mg by mouth twice a day  Drink plenty of fluids (prune juice may be helpful) and high fiber foods Miralax (over the counter) for constipation as needed.    Itching:  If you experience itching with your medications, try taking only a single pain pill, or even half a pain pill at a time.  You may take up to 10 pain pills per day, and you can also use benadryl over the counter for itching or also to help with sleep.   Precautions:  If you experience chest pain or shortness of breath - call 911 immediately for transfer to the hospital emergency department!!   Call office [phone]) for the following: Temperature greater than 101F Persistent nausea and vomiting Severe uncontrolled pain Redness, tenderness, or signs of infection (pain, swelling, redness, odor or green/yellow discharge around the site) Difficulty breathing, headache or visual disturbances Hives Persistent dizziness or light-headedness Extreme fatigue Any other questions or concerns you may have after discharge  In an emergency, call 911 or go to an Emergency Department at a nearby hospital  Follow- Up Appointment:  Please call for an appointment to be seen approximately 2 weeks after surgery in [facility] with your surgeon Dr. Weber Cooks - [phone] Address: [address], [place], [place] [address]     Post Anesthesia Home Care Instructions  Activity: Get plenty of rest for the remainder of the  day. A responsible individual must stay with you for 24 hours following the procedure.  For the next 24 hours, DO NOT: -Drive a car -Advertising copywriter -Drink alcoholic beverages -Take any medication unless instructed by your physician -Make any legal decisions or sign important papers.  Meals: Start with liquid foods such as gelatin or soup. Progress to regular foods as tolerated. Avoid greasy, spicy, heavy foods. If nausea and/or vomiting occur, drink only clear liquids until the nausea and/or vomiting subsides. Call your physician if vomiting continues.  Special Instructions/Symptoms: Your throat may feel dry or sore from the anesthesia or the breathing tube placed in your throat during surgery. If this causes discomfort, gargle with warm salt water. The discomfort should disappear within 24 hours.  If you had a scopolamine patch placed behind your ear for the management of post- operative nausea and/or vomiting:  1. The medication in the patch is effective for 72 hours, after which it should be removed.  Wrap patch in a tissue and discard in the trash. Wash hands thoroughly with soap and water. 2. You may remove the patch earlier than 72 hours if you experience unpleasant side effects which may include dry mouth, dizziness or visual disturbances. 3. Avoid touching the patch. Wash your hands with soap and water after contact with the patch.    Regional Anesthesia Blocks  1. Numbness or the inability to move the "blocked" extremity may last from 3-48 hours after placement. The length of time depends on the medication injected and your individual response  to the medication. If the numbness is not going away after 48 hours, call your surgeon.  2. The extremity that is blocked will need to be protected until the numbness is gone and the  Strength has returned. Because you cannot feel it, you will need to take extra care to avoid injury. Because it may be weak, you may have difficulty moving it  or using it. You may not know what position it is in without looking at it while the block is in effect.  3. For blocks in the legs and feet, returning to weight bearing and walking needs to be done carefully. You will need to wait until the numbness is entirely gone and the strength has returned. You should be able to move your leg and foot normally before you try and bear weight or walk. You will need someone to be with you when you first try to ensure you do not fall and possibly risk injury.  4. Bruising and tenderness at the needle site are common side effects and will resolve in a few days.  5. Persistent numbness or new problems with movement should be communicated to the surgeon or the [facility] [phone] [facility] [phone]).

## 2022-02-09 NOTE — Anesthesia Preprocedure Evaluation (Signed)
Anesthesia Evaluation  Patient identified by MRN, date of birth, ID band Patient awake    Reviewed: Allergy & Precautions, NPO status , Patient's Chart, lab work & pertinent test results  Airway Mallampati: II  TM Distance: >3 FB Neck ROM: Full    Dental no notable dental hx.    Pulmonary sleep apnea , former smoker,    Pulmonary exam normal        Cardiovascular negative cardio ROS   Rhythm:Regular Rate:Normal     Neuro/Psych  Headaches, Seizures - (09/13, single episode), Well Controlled,  Anxiety Depression    GI/Hepatic Neg liver ROS, GERD  ,  Endo/Other  negative endocrine ROS  Renal/GU negative Renal ROS  negative genitourinary   Musculoskeletal  (+) Arthritis , Restricted motion s/p TKA 12/22   Abdominal Normal abdominal exam  (+)   Peds  Hematology negative hematology ROS (+)   Anesthesia Other Findings   Reproductive/Obstetrics                             Anesthesia Physical Anesthesia Plan  ASA: 3  Anesthesia Plan: General and Regional   Post-op Pain Management: Regional block*   Induction: Intravenous  PONV Risk Score and Plan: 2 and Ondansetron, Dexamethasone, Midazolam and Treatment may vary due to age or medical condition  Airway Management Planned: Mask  Additional Equipment: None  Intra-op Plan:   Post-operative Plan:   Informed Consent: I have reviewed the patients History and Physical, chart, labs and discussed the procedure including the risks, benefits and alternatives for the proposed anesthesia with the patient or authorized representative who has indicated his/her understanding and acceptance.     Dental advisory given  Plan Discussed with: CRNA  Anesthesia Plan Comments:         Anesthesia Quick Evaluation

## 2022-02-09 NOTE — Anesthesia Procedure Notes (Signed)
Anesthesia Regional Block: Adductor canal block   Pre-Anesthetic Checklist: , timeout performed,  Correct Patient, Correct Site, Correct Laterality,  Correct Procedure, Correct Position, site marked,  Risks and benefits discussed,  Surgical consent,  Pre-op evaluation,  At surgeon's request and post-op pain management  Laterality: Left  Prep: Dura Prep       Needles:  Injection technique: Single-shot  Needle Type: Echogenic Stimulator Needle     Needle Length: 10cm  Needle Gauge: 20     Additional Needles:   Procedures:,,,, ultrasound used (permanent image in chart),,    Narrative:  Start time: 02/09/2022 6:55 AM End time: 02/09/2022 7:00 AM Injection made incrementally with aspirations every 5 mL.  Performed by: Personally  Anesthesiologist: Atilano Median, DO  Additional Notes: Patient identified. Risks/Benefits/Options discussed with patient including but not limited to bleeding, infection, nerve damage, failed block, incomplete pain control. Patient expressed understanding and wished to proceed. All questions were answered. Sterile technique was used throughout the entire procedure. Please see nursing notes for vital signs. Aspirated in 5cc intervals with injection for negative confirmation. Patient was given instructions on fall risk and not to get out of bed. All questions and concerns addressed with instructions to call with any issues or inadequate analgesia.

## 2022-02-09 NOTE — Op Note (Signed)
02/09/2022  7:15 AM  PATIENT:  Shannon Pace    PRE-OPERATIVE DIAGNOSIS:  LEFT TOTAL KNEE POST OP STIFFNESS  POST-OPERATIVE DIAGNOSIS:  Same  PROCEDURE:  CLOSED MANIPULATION LEFT KNEE  SURGEON:  Atha Muradyan A Meygan Kyser, MD  PHYSICIAN ASSISTANT: none  ANESTHESIA:   Sedation  PREOPERATIVE INDICATIONS:  Shannon Pace is a  46 y.o. male with a diagnosis of LEFT TOTAL KNEE POST OP STIFFNESS who failed conservative measures and elected for surgical management.    The risks benefits and alternatives were discussed with the patient preoperatively including but not limited to the risks of infection, bleeding, nerve injury, cardiopulmonary complications, the need for revision surgery, among others, and the patient was willing to proceed.  ESTIMATED BLOOD LOSS: none  OPERATIVE IMPLANTS: none  OPERATIVE FINDINGS: Successful manipulation left knee with disruption of adhesions.  Preop manipulation range of motion 0 to 90 degrees, postmanipulation range of motion 0 to 115 degrees.  OPERATIVE PROCEDURE:  Preop adductor canal block was performed in the preop area.  Patient was brought to the operating room.  He was kept on the stretcher.  No antibiotics were indicated.  Sedation and short acting paralytic were administered by anesthesia.  Timeout was performed.  Once appropriate Level of sedation was achieved, motion was assessed and determined to be about 0 to 90 degrees.  Applying gentle gradual pressure to the proximal tibia small pops and crackles were felt and heard disrupting the adhesions and scar tissue that were blocking his flexion range of motion.  Once no more adhesions were felt, motion was measured again and he was found to have about 115 degrees of flexion.  At this point his calf was coming in contact with his thigh, and to avoid risk of iatrogenic fracture,  no additional manipulation was performed.  The patient was awoken from the sedation and brought to the recovery room in  stable condition.   Post op recs: WB: WBAT Abx: none Imaging: PACU xrays Dressing: none DVT prophylaxis: none Follow up: 2 weeks after surgery for a range of motion check with Dr. Blanchie Dessert at [facility].  Address: [address], [place], [place] [address]  Office Phone: [phone]  Weber Cooks, MD Orthopaedic Surgery  Before:   After:

## 2022-02-09 NOTE — Transfer of Care (Signed)
Immediate Anesthesia Transfer of Care Note  Patient: Shannon Pace  Procedure(s) Performed: CLOSED MANIPULATION LEFT KNEE (Left: Knee)  Patient Location: PACU  Anesthesia Type:GA combined with regional for post-op pain  Level of Consciousness: drowsy and patient cooperative  Airway & Oxygen Therapy: Patient Spontanous Breathing and Patient connected to face mask oxygen  Post-op Assessment: Report given to RN and Post -op Vital signs reviewed and stable  Post vital signs: Reviewed and stable  Last Vitals:  Vitals Value Taken Time  BP    Temp    Pulse 75 02/09/22 0738  Resp 14 02/09/22 0738  SpO2 96 % 02/09/22 0738  Vitals shown include unvalidated device data.  Last Pain:  Vitals:   02/09/22 0636  TempSrc: Oral  PainSc: 3       Patients Stated Pain Goal: 3 ([id number])  Complications: No notable events documented.

## 2022-02-09 NOTE — Progress Notes (Signed)
Assisted Dr. Greg Stoltzfus with left, ultrasound guided, adductor canal block. Side rails up, monitors on throughout procedure. See vital signs in flow sheet. Tolerated Procedure well.  

## 2022-02-09 NOTE — Anesthesia Procedure Notes (Signed)
Procedure Name: General with mask airway Date/Time: 02/09/2022 7:25 AM Performed by: Sheryn Bison, CRNA Pre-anesthesia Checklist: Patient identified, Emergency Drugs available, Suction available, Patient being monitored and Timeout performed Patient Re-evaluated:Patient Re-evaluated prior to induction Oxygen Delivery Method: Circle system utilized

## 2022-02-09 NOTE — Anesthesia Postprocedure Evaluation (Signed)
Anesthesia Post Note  Patient: Shannon Pace  Procedure(s) Performed: CLOSED MANIPULATION LEFT KNEE (Left: Knee)     Patient location during evaluation: PACU Anesthesia Type: Regional and General Level of consciousness: awake and alert Pain management: pain level controlled Vital Signs Assessment: post-procedure vital signs reviewed and stable Respiratory status: spontaneous breathing, nonlabored ventilation, respiratory function stable and patient connected to nasal cannula oxygen Cardiovascular status: blood pressure returned to baseline and stable Postop Assessment: no apparent nausea or vomiting Anesthetic complications: no   No notable events documented.  Last Vitals:  Vitals:   02/09/22 0800 02/09/22 0833  BP: 102/72 111/84  Pulse: 65 73  Resp: 16 18  Temp:  36.6 C  SpO2: 96% 94%    Last Pain:  Vitals:   02/09/22 0833  TempSrc: Oral  PainSc: 4                  Ayda Tancredi P Emari Demmer

## 2022-02-12 ENCOUNTER — Encounter (HOSPITAL_BASED_OUTPATIENT_CLINIC_OR_DEPARTMENT_OTHER): Payer: Self-pay | Admitting: Orthopedic Surgery

## 2022-02-13 NOTE — Addendum Note (Signed)
Addendum  created 02/13/22 0758 by Glory Buff, CRNA   Charge Capture section accepted

## 2022-03-27 IMAGING — DX DG KNEE 1-2V PORT*L*
2 series · 2 of 2 positions shown · non-contrast
Comparison: 11/15/2021

CLINICAL DATA: Knee replacement

EXAM:
PORTABLE LEFT KNEE - 1-2 VIEW

[knee lat]
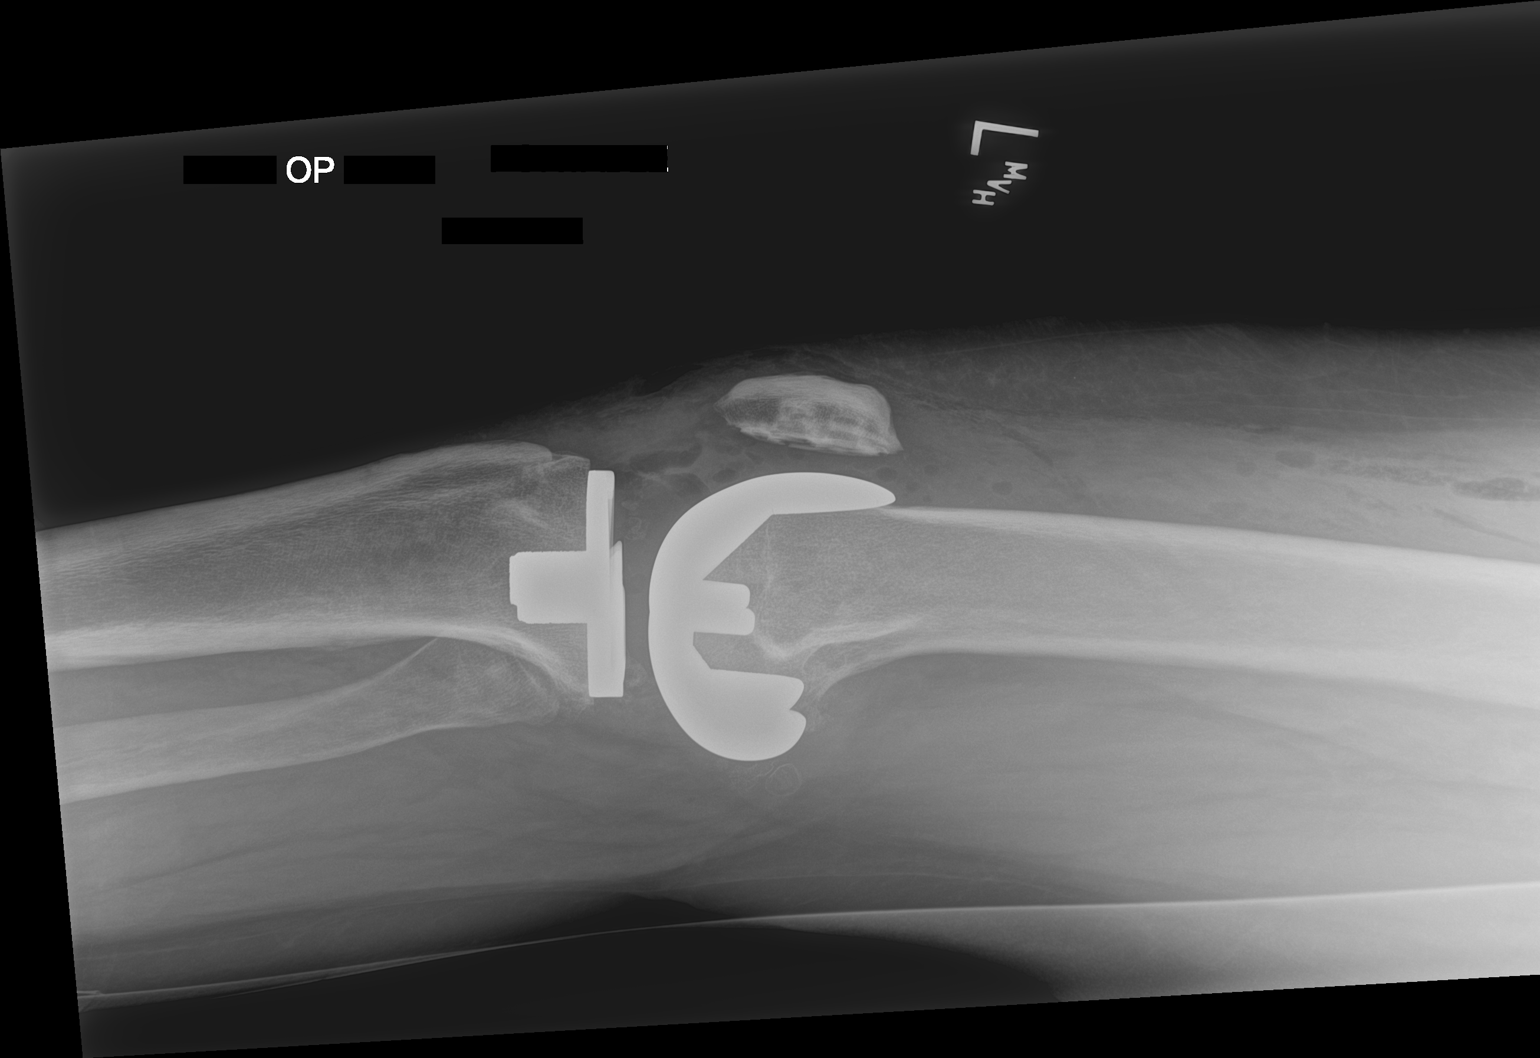

[knee obl]
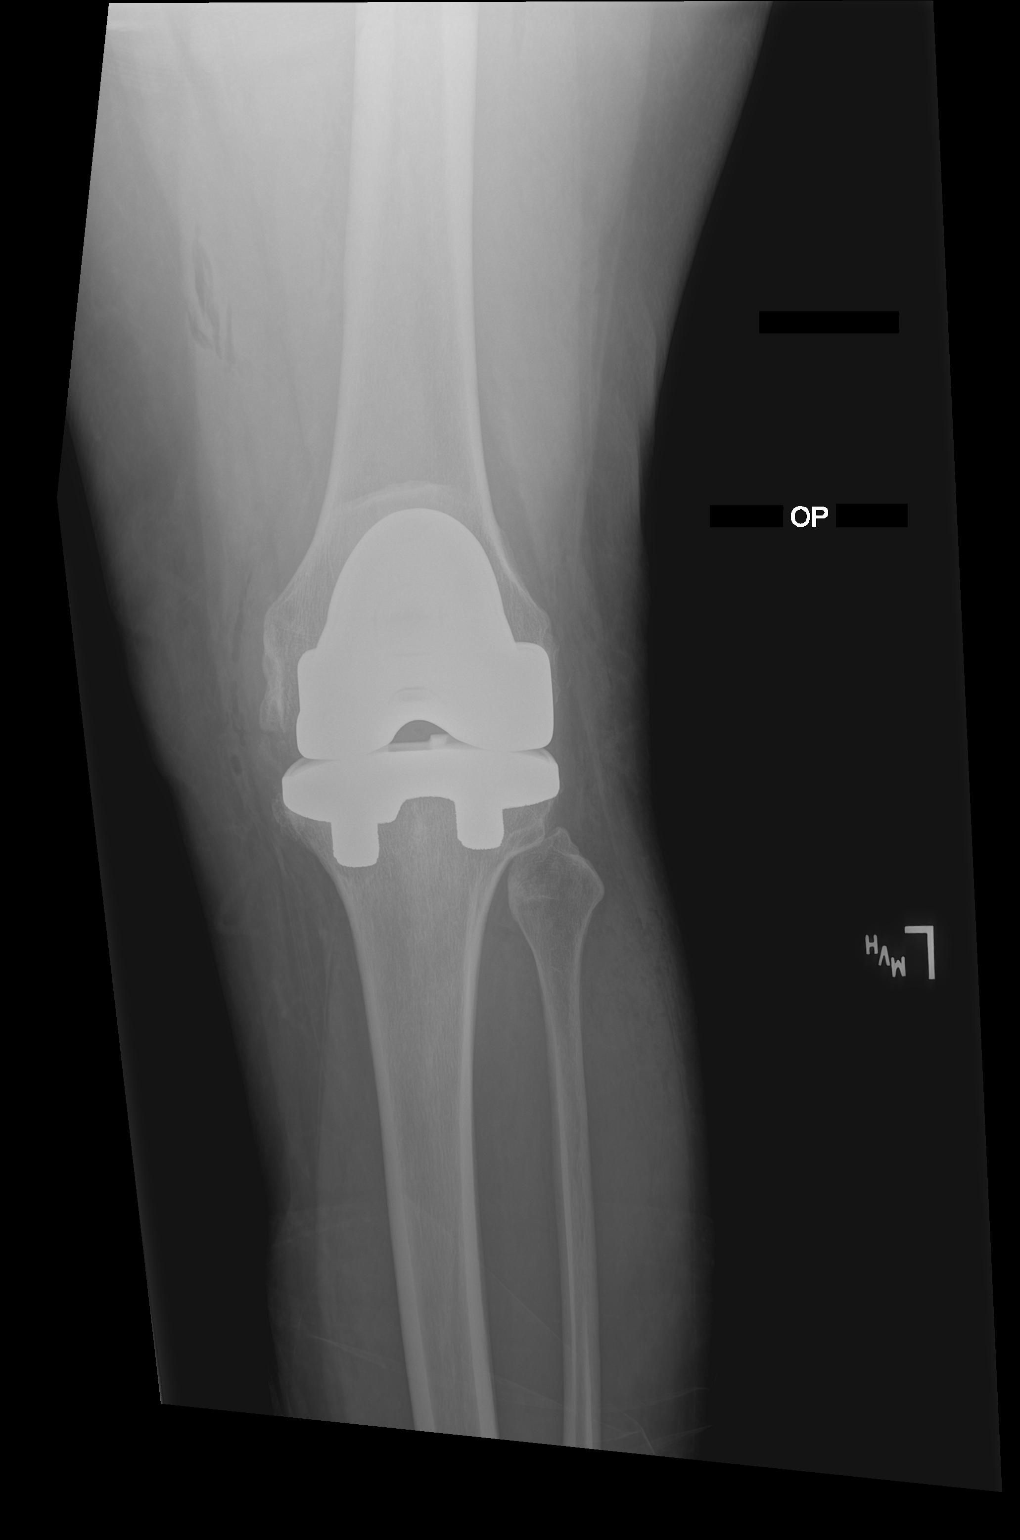

[2 of 2 positions shown; findings below may reference images not displayed]

FINDINGS: Status post left knee replacement with intact hardware and normal
alignment. Gas in the soft tissues consistent with recent surgery.
IMPRESSION: Status post knee replacement with expected postsurgical change
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
## 2022-06-14 ENCOUNTER — Ambulatory Visit: Payer: Medicare Other | Admitting: Neurology

## 2022-06-14 ENCOUNTER — Telehealth: Payer: Self-pay | Admitting: Neurology

## 2022-06-14 NOTE — Telephone Encounter (Signed)
Pt's wife, Duaine Radin cancelled appt due to having a sore throat

## 2022-08-18 ENCOUNTER — Encounter (HOSPITAL_BASED_OUTPATIENT_CLINIC_OR_DEPARTMENT_OTHER): Payer: Self-pay | Admitting: Emergency Medicine

## 2022-08-18 ENCOUNTER — Emergency Department (HOSPITAL_BASED_OUTPATIENT_CLINIC_OR_DEPARTMENT_OTHER): Payer: Medicare Other

## 2022-08-18 ENCOUNTER — Emergency Department (HOSPITAL_BASED_OUTPATIENT_CLINIC_OR_DEPARTMENT_OTHER)
Admission: EM | Admit: 2022-08-18 | Discharge: 2022-08-18 | Disposition: A | Discharge status: Home / Self Care | Payer: Medicare Other | Attending: Emergency Medicine | Admitting: Emergency Medicine

## 2022-08-18 ENCOUNTER — Other Ambulatory Visit: Payer: Self-pay

## 2022-08-18 DIAGNOSIS — R1013 Epigastric pain: Secondary | ICD-10-CM | POA: Diagnosis not present

## 2022-08-18 DIAGNOSIS — D72829 Elevated white blood cell count, unspecified: Secondary | ICD-10-CM | POA: Insufficient documentation

## 2022-08-18 LAB — CBC WITH DIFFERENTIAL/PLATELET
Abs Immature Granulocytes: 0.03 10*3/uL (ref 0.00–0.07)
Basophils Absolute: 0.1 10*3/uL (ref 0.0–0.1)
Basophils Relative: 1 %
Eosinophils Absolute: 0.2 10*3/uL (ref 0.0–0.5)
Eosinophils Relative: 2 %
HCT: 42.2 % (ref 39.0–52.0)
Hemoglobin: 13.9 g/dL (ref 13.0–17.0)
Immature Granulocytes: 0 %
Lymphocytes Relative: 12 %
Lymphs Abs: 1.4 10*3/uL (ref 0.7–4.0)
MCH: 30.5 pg (ref 26.0–34.0)
MCHC: 32.9 g/dL (ref 30.0–36.0)
MCV: 92.7 fL (ref 80.0–100.0)
Monocytes Absolute: 0.7 10*3/uL (ref 0.1–1.0)
Monocytes Relative: 5 %
Neutro Abs: 10 10*3/uL — ABNORMAL HIGH (ref 1.7–7.7)
Neutrophils Relative %: 80 %
Platelets: 294 10*3/uL (ref 150–400)
RBC: 4.55 MIL/uL (ref 4.22–5.81)
RDW: 12.4 % (ref 11.5–15.5)
WBC: 12.4 10*3/uL — ABNORMAL HIGH (ref 4.0–10.5)
nRBC: 0 % (ref 0.0–0.2)

## 2022-08-18 LAB — COMPREHENSIVE METABOLIC PANEL
ALT: 28 U/L (ref 0–44)
AST: 30 U/L (ref 15–41)
Albumin: 3.9 g/dL (ref 3.5–5.0)
Alkaline Phosphatase: 74 U/L (ref 38–126)
Anion gap: 6 (ref 5–15)
BUN: 20 mg/dL (ref 6–20)
CO2: 30 mmol/L (ref 22–32)
Calcium: 9.2 mg/dL (ref 8.9–10.3)
Chloride: 103 mmol/L (ref 98–111)
Creatinine, Ser: 0.8 mg/dL (ref 0.61–1.24)
GFR, Estimated: >60 mL/min (ref >60)
Glucose, Bld: 127 mg/dL — ABNORMAL HIGH (ref 70–99)
Potassium: 3.7 mmol/L (ref 3.5–5.1)
Sodium: 139 mmol/L (ref 135–145)
Total Bilirubin: 0.4 mg/dL (ref 0.3–1.2)
Total Protein: 7.5 g/dL (ref 6.5–8.1)

## 2022-08-18 LAB — URINALYSIS, ROUTINE W REFLEX MICROSCOPIC
Bilirubin Urine: NEGATIVE
Glucose, UA: NEGATIVE mg/dL
Ketones, ur: NEGATIVE mg/dL
Leukocytes,Ua: NEGATIVE
Nitrite: NEGATIVE
Protein, ur: NEGATIVE mg/dL
Specific Gravity, Urine: 1.01 (ref 1.005–1.030)
pH: 7 (ref 5.0–8.0)

## 2022-08-18 LAB — URINALYSIS, MICROSCOPIC (REFLEX)

## 2022-08-18 LAB — LIPASE, BLOOD: Lipase: 25 U/L (ref 11–51)

## 2022-08-18 LAB — TROPONIN I (HIGH SENSITIVITY): Troponin I (High Sensitivity): <2 ng/L (ref <18)

## 2022-08-18 MED ORDER — PANTOPRAZOLE SODIUM 40 MG PO TBEC
40.0000 mg | DELAYED_RELEASE_TABLET | Freq: Every day | ORAL | 0 refills | Status: DC
Start: 2022-08-18 — End: 2024-01-30

## 2022-08-18 MED ORDER — CIPROFLOXACIN HCL 500 MG PO TABS: 500.0000 mg | ORAL_TABLET | Freq: Two times a day (BID) | ORAL | 0 refills | Status: AC

## 2022-08-18 MED ORDER — IOHEXOL 300 MG/ML  SOLN
100.0000 mL | Freq: Once | INTRAMUSCULAR | Status: AC | PRN
Start: 2022-08-18 — End: 2022-08-18
  Administered 2022-08-18: 100 mL via INTRAVENOUS

## 2022-08-18 MED ORDER — LIDOCAINE VISCOUS HCL 2 % MT SOLN
15.0000 mL | Freq: Once | OROMUCOSAL | Status: AC
  Administered 2022-08-18: 15 mL via ORAL
  Filled 2022-08-18: qty 15

## 2022-08-18 MED ORDER — ONDANSETRON HCL 4 MG/2ML IJ SOLN
4.0000 mg | Freq: Once | INTRAMUSCULAR | Status: AC
  Administered 2022-08-18: 4 mg via INTRAVENOUS
  Filled 2022-08-18: qty 2

## 2022-08-18 MED ORDER — METRONIDAZOLE 500 MG PO TABS: 500.0000 mg | ORAL_TABLET | Freq: Two times a day (BID) | ORAL | 0 refills | Status: DC

## 2022-08-18 MED ORDER — ONDANSETRON 4 MG PO TBDP: 4.0000 mg | ORAL_TABLET | Freq: Three times a day (TID) | ORAL | 0 refills | Status: AC | PRN

## 2022-08-18 MED ORDER — ALUM & MAG HYDROXIDE-SIMETH 200-200-20 MG/5ML PO SUSP
30.0000 mL | Freq: Once | ORAL | Status: AC
  Administered 2022-08-18: 30 mL via ORAL
  Filled 2022-08-18: qty 30

## 2022-08-18 MED ORDER — MORPHINE SULFATE (PF) 4 MG/ML IV SOLN
4.0000 mg | Freq: Once | INTRAVENOUS | Status: AC
  Administered 2022-08-18: 4 mg via INTRAVENOUS
  Filled 2022-08-18: qty 1

## 2022-08-18 NOTE — ED Provider Notes (Signed)
[facility] EMERGENCY DEPARTMENT Provider Note   CSN: [id number] Arrival date & time: 08/18/22  2012     History  Chief Complaint  Patient presents with   Abdominal Pain    Epigastric    Shannon Pace is a 46 y.o. male.  46 year old male presents today with his wife for evaluation of epigastric abdominal pain associated with nausea, lightheadedness, and diaphoresis.  Patient has history significant for brain injury, tobacco use.  Denies other significant past medical history.  States dizziness/lightheadedness is normal for him given his history of brain injury.  States he was hunting with his brother today.  He ate deep-fried peanuts including the shells.  He states he has had similar epigastric abdominal pain after eating nuts in the past.  Pain has significantly improved since earlier today.  Currently rates his pain as 1/10.  Denies history of CAD, or MI.  Denies significant family history of cardiac disease.  The history is provided by the patient. No language interpreter was used.       Home Medications Prior to Admission medications   Medication Sig Start Date End Date Taking? Authorizing Provider  Ascorbic Acid (VITAMIN C) 1000 MG tablet Take 1,000 mg by mouth daily.    [provider]  Buprenorphine HCl-Naloxone HCl 8-2 MG FILM Place 0.5 Film under the tongue See admin instructions. Take 0.5 film 3 times daily, increase to 0.5 film 4 times daily when pain increases    [provider]  celecoxib (CELEBREX) 200 MG capsule Take 200 mg by mouth 2 (two) times daily.    [provider]  Dextran 70-Hypromellose ([employer] NATURAL TEARS OP) Place 1 drop into both eyes daily.    [provider]  FLUoxetine (PROZAC) 10 MG capsule Take 10 mg by mouth daily. 10/10/21   [provider]  lamoTRIgine (LAMICTAL) 100 MG tablet Take 1 tablet (100 mg total) by mouth 2 (two) times daily. 06/14/21   Levert Feinstein, MD  sildenafil (REVATIO) 20 MG  tablet Take 20 mg by mouth daily as needed (ED). 02/29/20   [provider]      Allergies    Penicillins    Review of Systems   Review of Systems  Constitutional:  Positive for diaphoresis. Negative for chills and fever.  Respiratory:  Negative for shortness of breath.   Cardiovascular:  Negative for chest pain, palpitations and leg swelling.  Gastrointestinal:  Positive for abdominal pain and nausea. Negative for vomiting.  Genitourinary:  Negative for dysuria.  Neurological:  Positive for light-headedness. Negative for syncope and weakness.  All other systems reviewed and are negative.   Physical Exam Updated Vital Signs BP 130/89   Pulse 71   Temp 98.1 F (36.7 C) (Oral)   Resp 20   Ht 5\' 7"  (1.702 m)   Wt 99.3 kg   SpO2 100%   BMI 34.30 kg/m  Physical Exam Vitals and nursing note reviewed.  Constitutional:      General: He is not in acute distress.    Appearance: Normal appearance. He is not ill-appearing.  HENT:     Head: Normocephalic and atraumatic.     Nose: Nose normal.  Eyes:     General: No scleral icterus.    Extraocular Movements: Extraocular movements intact.     Conjunctiva/sclera: Conjunctivae normal.  Cardiovascular:     Rate and Rhythm: Normal rate and regular rhythm.     Pulses: Normal pulses.  Pulmonary:     Effort: Pulmonary effort  is normal. No respiratory distress.     Breath sounds: Normal breath sounds. No wheezing or rales.  Abdominal:     General: There is no distension.     Palpations: Abdomen is soft.     Tenderness: There is no abdominal tenderness. There is no guarding.  Musculoskeletal:        General: Normal range of motion.     Cervical back: Normal range of motion.     Right lower leg: No edema.     Left lower leg: No edema.  Skin:    General: Skin is warm and dry.  Neurological:     General: No focal deficit present.     Mental Status: He is alert. Mental status is at baseline.     ED Results / Procedures /  Treatments   Labs (all labs ordered are listed, but only abnormal results are displayed) Labs Reviewed  COMPREHENSIVE METABOLIC PANEL - Abnormal; Notable for the following components:      Result Value   Glucose, Bld 127 (*)    All other components within normal limits  CBC WITH DIFFERENTIAL/PLATELET - Abnormal; Notable for the following components:   WBC 12.4 (*)    Neutro Abs 10.0 (*)    All other components within normal limits  LIPASE, BLOOD  URINALYSIS, ROUTINE W REFLEX MICROSCOPIC  TROPONIN I (HIGH SENSITIVITY)    EKG EKG Interpretation  Date/Time:  Saturday August 18 2022 20:23:46 EDT Ventricular Rate:  67 PR Interval:  140 QRS Duration: 99 QT Interval:  422 QTC Calculation: 446 R Axis:   5 Text Interpretation: Sinus rhythm RSR' in V1 or V2, right VCD or RVH No significant change since prior 12/22 Confirmed by Meridee Score [phone]) on 08/18/2022 8:25:04 PM  Radiology CT ABDOMEN PELVIS W CONTRAST  Result Date: 08/18/2022 CLINICAL DATA:  Intermittent epigastric pain. Diaphoresis with mild nausea. EXAM: CT ABDOMEN AND PELVIS WITH CONTRAST TECHNIQUE: Multidetector CT imaging of the abdomen and pelvis was performed using the standard protocol following bolus administration of intravenous contrast. RADIATION DOSE REDUCTION: This exam was performed according to the departmental dose-optimization program which includes automated exposure control, adjustment of the mA and/or kV according to patient size and/or use of iterative reconstruction technique. CONTRAST:  OMNIPAQUE IOHEXOL 300 MG/ML  SOLN COMPARISON:  None Available. FINDINGS: Lower chest: A 4 mm nodule is present in the right lower lobe, axial image 6. Hepatobiliary: No focal liver abnormality is seen. No gallstones, gallbladder wall thickening, or biliary dilatation. Pancreas: Unremarkable. No pancreatic ductal dilatation or surrounding inflammatory changes. Spleen: Normal in size without focal abnormality.  Adrenals/Urinary Tract: Adrenal glands are unremarkable. Kidneys are normal, without renal calculi, focal lesion, or hydronephrosis. Bladder is unremarkable. Stomach/Bowel: Trace amount of fluid is noted in the distal esophagus, suggesting reflux. The stomach is unremarkable. No bowel obstruction, free air, or pneumatosis. A normal appendix is present in the right lower quadrant. There are few loops of small bowel in the mid abdomen with associated hyperemia and mild mesenteric edema, possible enteritis. Vascular/Lymphatic: No significant vascular findings are present. No enlarged abdominal or pelvic lymph nodes. Reproductive: Prostate is unremarkable. Other: Small amount of free fluid in the pelvis. Fat containing inguinal hernias are noted bilaterally. There is a small fat containing umbilical hernia. Musculoskeletal: No acute osseous abnormality. IMPRESSION: 1. Loops of small bowel in the mid abdomen with hyperemia and mesenteric edema, possible enteritis. 2. Small amount of free fluid in the pelvis. 3. Small amount of fluid in the  distal esophagus suggesting reflux. 4. 4 mm right lower lobe pulmonary nodule. No follow-up needed if patient is low-risk.This recommendation follows the consensus statement: Guidelines for Management of Incidental Pulmonary Nodules Detected on CT Images: From the Fleischner Society 2017; Radiology 2017; 284:228-243. Electronically Signed   By: Thornell Sartorius M.D.   On: 08/18/2022 22:04   DG Chest 2 View  Result Date: 08/18/2022 CLINICAL DATA:  Chest pain. EXAM: CHEST - 2 VIEW COMPARISON:  Chest radiograph dated 08/19/2012. FINDINGS: No focal consolidation, pleural effusion, pneumothorax the cardiac silhouette is within normal limits. No acute osseous pathology. Mild scoliosis. IMPRESSION: No active cardiopulmonary disease. Electronically Signed   By: Elgie Collard M.D.   On: 08/18/2022 21:48    Procedures Procedures    Medications Ordered in ED Medications  alum & mag  hydroxide-simeth (MAALOX/MYLANTA) 200-200-20 MG/5ML suspension 30 mL (30 mLs Oral Given 08/18/22 2150)    And  lidocaine (XYLOCAINE) 2 % viscous mouth solution 15 mL (15 mLs Oral Given 08/18/22 2150)  ondansetron (ZOFRAN) injection 4 mg (4 mg Intravenous Given 08/18/22 2150)  morphine (PF) 4 MG/ML injection 4 mg (4 mg Intravenous Given 08/18/22 2150)  iohexol (OMNIPAQUE) 300 MG/ML solution 100 mL (100 mLs Intravenous Contrast Given 08/18/22 2137)    ED Course/ Medical Decision Making/ A&P                           Medical Decision Making Amount and/or Complexity of Data Reviewed Labs: ordered. Radiology: ordered.  Risk OTC drugs. Prescription drug management.   Medical Decision Making / ED Course   This patient presents to the ED for concern of epigastric abdominal pain, this involves an extensive number of treatment options, and is a complaint that carries with it a high risk of complications and morbidity.  The differential diagnosis includes pancreatitis, cholecystitis, gastroenteritis, ACS, pneumonia, GERD  MDM: 46 year old male presents today for evaluation of epigastric abdominal pain.  Onset around 3:30 PM.  Progressively got worse and was associated with diaphoresis, lightheadedness, and nausea.  No prior history of CAD or cardiac disease.  Significantly improved male.  Abdomen is nontender and nondistended.  Overall abdominal exam is benign.  Does rate pain currently as 1/10.  Despite no tenderness to palpation.  Surrounding the episode patient was eating deep-fried peanuts with shells.  Currently comfortable.  Denies significant family history of cardiac disease.  Abdominal labs were ordered from triage.  CBC shows mild leukocytosis without left shift otherwise unremarkable.  CMP with glucose 127 otherwise unremarkable.  Lipase within normal limits.  Will add on chest x-ray, troponin.  EKG was completed and shows normal sinus rhythm without acute ischemic changes.  Will provide pain  medication, GI cocktail and obtain CT abdomen pelvis with contrast. Chest x-ray without acute cardiopulmonary process. CT abdomen pelvis shows subcentimeter nodule.  Given his history of tobacco abuse discussed follow-up with PCP for monitoring.  Potential reflux noted.  Changes of enteritis noted.  Given mild leukocytosis and no significant signs of gastroenteritis will treat with Augmentin to cover for any infectious etiology.  Otherwise no acute intra-abdominal process noted.  Troponin negative.  Doubt ACS. Will prescribe Protonix, Cipro and Flagyl, Zofran ODT.  Return precautions discussed.  Discussed follow-up with PCP.  Gastroenterology referral given in case he does not have improvement in symptoms.  Patient and wife both voiced understanding and are in agreement with plan.   Lab Tests: -I ordered, reviewed, and interpreted labs.  The pertinent results include:   Labs Reviewed  COMPREHENSIVE METABOLIC PANEL - Abnormal; Notable for the following components:      Result Value   Glucose, Bld 127 (*)    All other components within normal limits  CBC WITH DIFFERENTIAL/PLATELET - Abnormal; Notable for the following components:   WBC 12.4 (*)    Neutro Abs 10.0 (*)    All other components within normal limits  LIPASE, BLOOD  URINALYSIS, ROUTINE W REFLEX MICROSCOPIC  TROPONIN I (HIGH SENSITIVITY)  TROPONIN I (HIGH SENSITIVITY)      EKG  EKG Interpretation  Date/Time:  Saturday August 18 2022 20:23:46 EDT Ventricular Rate:  67 PR Interval:  140 QRS Duration: 99 QT Interval:  422 QTC Calculation: 446 R Axis:   5 Text Interpretation: Sinus rhythm RSR' in V1 or V2, right VCD or RVH No significant change since prior 12/22 Confirmed by Meridee Score [phone]) on 08/18/2022 8:25:04 PM         Imaging Studies ordered: I ordered imaging studies including chest x-ray, CT abdomen pelvis I independently visualized and interpreted imaging. I agree with the radiologist  interpretation   Medicines ordered and prescription drug management: Meds ordered this encounter  Medications   AND Linked Order Group    alum & mag hydroxide-simeth (MAALOX/MYLANTA) 200-200-20 MG/5ML suspension 30 mL    lidocaine (XYLOCAINE) 2 % viscous mouth solution 15 mL   ondansetron (ZOFRAN) injection 4 mg   morphine (PF) 4 MG/ML injection 4 mg   iohexol (OMNIPAQUE) 300 MG/ML solution 100 mL    -I have reviewed the patients home medicines and have made adjustments as needed  Cardiac Monitoring: The patient was maintained on a cardiac monitor.  I personally viewed and interpreted the cardiac monitored which showed an underlying rhythm of: Normal sinus rhythm  Reevaluation: After the interventions noted above, I reevaluated the patient and found that they have :improved  Co morbidities that complicate the patient evaluation  Past Medical History:  Diagnosis Date   Anginal pain (HCC) 08/19/2012   "grabbed my heart; fell; had seizure"   Anxiety    Arthritis    Chronic lower back pain 08/18/2012   Daily headache 08/19/2012   "since going off Percocet; having withdrawals"   Depression    External hemorrhoid, bleeding 08/18/2012   GERD (gastroesophageal reflux disease)    Heart murmur 08/18/2012   "think so"   Memory loss    Migraine    Personal history of traumatic brain injury    Scoliosis ~ 1991   "I got 2 curves; S-curve in my lower back"   Scoliosis    Seizures (HCC) 08/18/2012   "first one ever"   Skull fracture (HCC) 2006   "cracked the back of my head"   Sleep apnea    Torn ligament 11/2018   R ankle   Traumatic brain injury [facility]) 2006   "personality part got damaged"      Dispostion: Patient is appropriate for discharge.  Discharged in stable condition.  Return precautions discussed.  Final Clinical Impression(s) / ED Diagnoses Final diagnoses:  Epigastric pain    Rx / DC Orders ED Discharge Orders          Ordered    pantoprazole (PROTONIX)  40 MG tablet  Daily        08/18/22 2339    ondansetron (ZOFRAN-ODT) 4 MG disintegrating tablet  Every 8 hours PRN        08/18/22 2339    ciprofloxacin (CIPRO) 500  MG tablet  Every 12 hours        08/18/22 2339    metroNIDAZOLE (FLAGYL) 500 MG tablet  2 times daily        08/18/22 2339              Marita Kansasli, Ahniya Mitchum, PA-C 08/18/22 2341    Terrilee FilesButler, Michael C, MD 08/19/22 1004

## 2022-08-18 NOTE — Discharge Instructions (Addendum)
Your work-up today was overall reassuring.  No concerning cause of your abdominal pain.  We also evaluated your heart and lungs.  Your heart enzyme, EKG, chest x-ray were all normal.  Your abdominal CT scan did show changes consistent with acid reflux, and inflammation.  I have sent antibiotics into the pharmacy for you along with Protonix.  Take these as directed.  Have given you gastroenterology follow-up if you do not have improvement in your symptoms.  For any concerning symptoms return to the emergency room.

## 2022-08-18 NOTE — ED Triage Notes (Signed)
Intermittent epigastric pain since 2 pm today. Describes pain as cramping. Endorse diaphroesis and mild nausea. Denies radiation, vomiting.

## 2022-11-15 ENCOUNTER — Ambulatory Visit: Payer: Medicare Other | Admitting: Neurology

## 2022-11-15 ENCOUNTER — Telehealth: Payer: Self-pay | Admitting: Neurology

## 2022-11-15 NOTE — Telephone Encounter (Signed)
LVM informing pt of need to reschedule 11/30 appointment - Maralyn Sago out

## 2022-11-20 ENCOUNTER — Other Ambulatory Visit: Payer: Self-pay | Admitting: Neurology

## 2022-11-21 ENCOUNTER — Telehealth: Payer: Self-pay | Admitting: Neurology

## 2022-11-21 MED ORDER — LAMOTRIGINE 100 MG PO TABS: 100.0000 mg | ORAL_TABLET | Freq: Two times a day (BID) | ORAL | 4 refills | Status: DC

## 2022-11-21 NOTE — Telephone Encounter (Signed)
I called [facility] and spoke with rep Morrie Sheldon, she confirmed rx is needed for medication.  I have submitted and they will ready refill for pick up.  Please update pt he should be able to pick med up today. Thanks.

## 2022-11-21 NOTE — Telephone Encounter (Signed)
[facility] informed to patient could not pick up until Monday. Pharmacy advise to call neurologist to try to get early. Pt said  been out of medication for 3 days and starting to feel funny.Would like a call from the nurse  Have scheduled pt a f/u appt on 20/20/24.

## 2022-11-22 NOTE — Telephone Encounter (Signed)
Pt called back. Stated he picked up prescription yesterday. He said thank you and have a great day.

## 2022-11-22 NOTE — Telephone Encounter (Signed)
Called pt. Left a VM to call the office.

## 2023-02-05 ENCOUNTER — Ambulatory Visit: Payer: Medicare Other | Admitting: Neurology

## 2024-01-28 ENCOUNTER — Other Ambulatory Visit: Payer: Self-pay | Admitting: Neurology

## 2024-01-29 ENCOUNTER — Telehealth: Payer: Self-pay

## 2024-01-29 NOTE — Telephone Encounter (Signed)
Call to wife, she is in agreement to bring patient tomorrow at 10. If appointment is missed no further refills of lamictal given.30 day supply sent today

## 2024-01-30 ENCOUNTER — Encounter: Payer: Self-pay | Admitting: Neurology

## 2024-01-30 ENCOUNTER — Ambulatory Visit: Payer: Medicare Other | Admitting: Neurology

## 2024-01-30 VITALS — BP 110/73 | HR 79 | Ht 67.0 in | Wt 239.0 lb

## 2024-01-30 DIAGNOSIS — R569 Unspecified convulsions: Secondary | ICD-10-CM | POA: Diagnosis not present

## 2024-01-30 DIAGNOSIS — S069X0S Unspecified intracranial injury without loss of consciousness, sequela: Secondary | ICD-10-CM | POA: Diagnosis not present

## 2024-01-30 MED ORDER — LAMOTRIGINE ER 200 MG PO TB24: 200.0000 mg | ORAL_TABLET | Freq: Every day | ORAL | 3 refills | Status: AC
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
## 2024-03-13 ENCOUNTER — Other Ambulatory Visit: Payer: Medicare Other | Admitting: *Deleted

## 2024-03-25 ENCOUNTER — Other Ambulatory Visit: Payer: Self-pay | Admitting: Neurology
